# Patient Record
Sex: Female | Born: 1935 | Race: Black or African American | Hispanic: No | Marital: Married | Smoking: Former smoker
Health system: Southern US, Community
[De-identification: ages and names within clinical notes are randomized; demographics above are authoritative.]

## PROBLEM LIST (undated history)

## (undated) DIAGNOSIS — E119 Type 2 diabetes mellitus without complications: Secondary | ICD-10-CM

## (undated) DIAGNOSIS — J189 Pneumonia, unspecified organism: Secondary | ICD-10-CM

## (undated) DIAGNOSIS — I10 Essential (primary) hypertension: Secondary | ICD-10-CM

## (undated) HISTORY — PX: THYROID SURGERY: SHX805

## (undated) HISTORY — PX: OTHER SURGICAL HISTORY: SHX169

---

## 2013-09-14 DIAGNOSIS — J189 Pneumonia, unspecified organism: Secondary | ICD-10-CM

## 2013-09-14 HISTORY — DX: Pneumonia, unspecified organism: J18.9

## 2014-09-19 ENCOUNTER — Emergency Department (HOSPITAL_COMMUNITY): Payer: Medicare Other

## 2014-09-19 ENCOUNTER — Encounter (HOSPITAL_COMMUNITY): Payer: Self-pay | Admitting: Emergency Medicine

## 2014-09-19 ENCOUNTER — Emergency Department (HOSPITAL_COMMUNITY)
Admission: EM | Admit: 2014-09-19 | Discharge: 2014-09-19 | Disposition: A | Discharge status: Home / Self Care | Payer: Medicare Other | Attending: Emergency Medicine | Admitting: Emergency Medicine

## 2014-09-19 DIAGNOSIS — R0600 Dyspnea, unspecified: Secondary | ICD-10-CM

## 2014-09-19 DIAGNOSIS — E119 Type 2 diabetes mellitus without complications: Secondary | ICD-10-CM | POA: Diagnosis not present

## 2014-09-19 DIAGNOSIS — Z87891 Personal history of nicotine dependence: Secondary | ICD-10-CM | POA: Insufficient documentation

## 2014-09-19 DIAGNOSIS — J40 Bronchitis, not specified as acute or chronic: Secondary | ICD-10-CM | POA: Diagnosis not present

## 2014-09-19 DIAGNOSIS — I10 Essential (primary) hypertension: Secondary | ICD-10-CM | POA: Insufficient documentation

## 2014-09-19 DIAGNOSIS — R0602 Shortness of breath: Secondary | ICD-10-CM | POA: Diagnosis present

## 2014-09-19 HISTORY — DX: Essential (primary) hypertension: I10

## 2014-09-19 HISTORY — DX: Type 2 diabetes mellitus without complications: E11.9

## 2014-09-19 MED ORDER — ALBUTEROL SULFATE HFA 108 (90 BASE) MCG/ACT IN AERS
2.0000 | INHALATION_SPRAY | Freq: Once | RESPIRATORY_TRACT | Status: AC
  Administered 2014-09-19: 2 via RESPIRATORY_TRACT
  Filled 2014-09-19: qty 6.7

## 2014-09-19 MED ORDER — ALBUTEROL SULFATE (2.5 MG/3ML) 0.083% IN NEBU: 2.5000 mg | INHALATION_SOLUTION | Freq: Four times a day (QID) | RESPIRATORY_TRACT | Status: DC | PRN

## 2014-09-19 MED ORDER — ALBUTEROL (5 MG/ML) CONTINUOUS INHALATION SOLN
10.0000 mg/h | INHALATION_SOLUTION | RESPIRATORY_TRACT | Status: DC
  Administered 2014-09-19: 10 mg/h via RESPIRATORY_TRACT
  Filled 2014-09-19: qty 20

## 2014-09-19 MED ORDER — PREDNISONE 20 MG PO TABS
40.0000 mg | ORAL_TABLET | Freq: Once | ORAL | Status: AC
  Administered 2014-09-19: 40 mg via ORAL
  Filled 2014-09-19: qty 2

## 2014-09-19 MED ORDER — PREDNISONE 20 MG PO TABS: 40.0000 mg | ORAL_TABLET | Freq: Every day | ORAL | Status: DC

## 2014-09-19 MED ORDER — AEROCHAMBER Z-STAT PLUS/MEDIUM MISC
1.0000 | Freq: Once | Status: AC
  Administered 2014-09-19: 1

## 2014-09-19 MED ORDER — AEROCHAMBER PLUS W/MASK MISC
1.0000 | Freq: Once | Status: DC
  Filled 2014-09-19: qty 1

## 2014-09-19 MED ORDER — IPRATROPIUM-ALBUTEROL 0.5-2.5 (3) MG/3ML IN SOLN
3.0000 mL | Freq: Once | RESPIRATORY_TRACT | Status: AC
  Administered 2014-09-19: 3 mL via RESPIRATORY_TRACT
  Filled 2014-09-19: qty 3

## 2014-09-19 NOTE — Discharge Instructions (Signed)
How to Use an Inhaler Proper inhaler technique is very important. Good technique ensures that the medicine reaches the lungs. Poor technique results in depositing the medicine on the tongue and back of the throat rather than in the airways. If you do not use the inhaler with good technique, the medicine will not help you. STEPS TO FOLLOW IF USING AN INHALER WITHOUT AN EXTENSION TUBE 1. Remove the cap from the inhaler. 2. If you are using the inhaler for the first time, you will need to prime it. Shake the inhaler for 5 seconds and release four puffs into the air, away from your face. Ask your health care provider or pharmacist if you have questions about priming your inhaler. 3. Shake the inhaler for 5 seconds before each breath in (inhalation). 4. Position the inhaler so that the top of the canister faces up. 5. Put your index finger on the top of the medicine canister. Your thumb supports the bottom of the inhaler. 6. Open your mouth. 7. Either place the inhaler between your teeth and place your lips tightly around the mouthpiece, or hold the inhaler 1-2 inches away from your open mouth. If you are unsure of which technique to use, ask your health care provider. 8. Breathe out (exhale) normally and as completely as possible. 9. Press the canister down with your index finger to release the medicine. 10. At the same time as the canister is pressed, inhale deeply and slowly until your lungs are completely filled. This should take 4-6 seconds. Keep your tongue down. 11. Hold the medicine in your lungs for 5-10 seconds (10 seconds is best). This helps the medicine get into the small airways of your lungs. 12. Breathe out slowly, through pursed lips. Whistling is an example of pursed lips. 13. Wait at least 15-30 seconds between puffs. Continue with the above steps until you have taken the number of puffs your health care provider has ordered. Do not use the inhaler more than your health care provider  tells you. 14. Replace the cap on the inhaler. 15. Follow the directions from your health care provider or the inhaler insert for cleaning the inhaler. STEPS TO FOLLOW IF USING AN INHALER WITH AN EXTENSION (SPACER) 1. Remove the cap from the inhaler. 2. If you are using the inhaler for the first time, you will need to prime it. Shake the inhaler for 5 seconds and release four puffs into the air, away from your face. Ask your health care provider or pharmacist if you have questions about priming your inhaler. 3. Shake the inhaler for 5 seconds before each breath in (inhalation). 4. Place the open end of the spacer onto the mouthpiece of the inhaler. 5. Position the inhaler so that the top of the canister faces up and the spacer mouthpiece faces you. 6. Put your index finger on the top of the medicine canister. Your thumb supports the bottom of the inhaler and the spacer. 7. Breathe out (exhale) normally and as completely as possible. 8. Immediately after exhaling, place the spacer between your teeth and into your mouth. Close your lips tightly around the spacer. 9. Press the canister down with your index finger to release the medicine. 10. At the same time as the canister is pressed, inhale deeply and slowly until your lungs are completely filled. This should take 4-6 seconds. Keep your tongue down and out of the way. 11. Hold the medicine in your lungs for 5-10 seconds (10 seconds is best). This helps the   medicine get into the small airways of your lungs. Exhale. 12. Repeat inhaling deeply through the spacer mouthpiece. Again hold that breath for up to 10 seconds (10 seconds is best). Exhale slowly. If it is difficult to take this second deep breath through the spacer, breathe normally several times through the spacer. Remove the spacer from your mouth. 13. Wait at least 15-30 seconds between puffs. Continue with the above steps until you have taken the number of puffs your health care provider has  ordered. Do not use the inhaler more than your health care provider tells you. 14. Remove the spacer from the inhaler, and place the cap on the inhaler. 15. Follow the directions from your health care provider or the inhaler insert for cleaning the inhaler and spacer. If you are using different kinds of inhalers, use your quick relief medicine to open the airways 10-15 minutes before using a steroid if instructed to do so by your health care provider. If you are unsure which inhalers to use and the order of using them, ask your health care provider, nurse, or respiratory therapist. If you are using a steroid inhaler, always rinse your mouth with water after your last puff, then gargle and spit out the water. Do not swallow the water. AVOID:  Inhaling before or after starting the spray of medicine. It takes practice to coordinate your breathing with triggering the spray.  Inhaling through the nose (rather than the mouth) when triggering the spray. HOW TO DETERMINE IF YOUR INHALER IS FULL OR NEARLY EMPTY You cannot know when an inhaler is empty by shaking it. A few inhalers are now being made with dose counters. Ask your health care provider for a prescription that has a dose counter if you feel you need that extra help. If your inhaler does not have a counter, ask your health care provider to help you determine the date you need to refill your inhaler. Write the refill date on a calendar or your inhaler canister. Refill your inhaler 7-10 days before it runs out. Be sure to keep an adequate supply of medicine. This includes making sure it is not expired, and that you have a spare inhaler.  SEEK MEDICAL CARE IF:   Your symptoms are only partially relieved with your inhaler.  You are having trouble using your inhaler.  You have some increase in phlegm. SEEK IMMEDIATE MEDICAL CARE IF:   You feel little or no relief with your inhalers. You are still wheezing and are feeling shortness of breath or  tightness in your chest or both.  You have dizziness, headaches, or a fast heart rate.  You have chills, fever, or night sweats.  You have a noticeable increase in phlegm production, or there is blood in the phlegm. MAKE SURE YOU:   Understand these instructions.  Will watch your condition.  Will get help right away if you are not doing well or get worse. Document Released: 08/28/2000 Document Revised: 06/21/2013 Document Reviewed: 03/30/2013 Exit[employer] Patient Information 2015 [employer]. This information is not intended to replace advice given to you by your health care provider. Make sure you discuss any questions you have with your health care provider.  

## 2014-09-19 NOTE — ED Notes (Addendum)
Post treatment pt reports unlabored breathing. Pt is speaking in full sentences. E wheezing still present. MD notified.

## 2014-09-19 NOTE — ED Provider Notes (Signed)
CSN: [id number]     Arrival date & time 09/19/14  [id number] History   First MD Initiated Contact with Patient 09/19/14 [id number]     Chief Complaint  Patient presents with  . Shortness of Breath     (Consider location/radiation/quality/duration/timing/severity/associated sxs/prior Treatment) HPI   87yF with cough and SOB. Onset about two weeks ago. Worse in past 2-3 days. Coughing, wheezing and feels SOB. Has been using albuterol MDI with some relief but daughter says she does not always use it properly. "I get hot and cold, but I'm a diabetic and I'm always like that." Denies pain anywhere. No unusual lleg pain or swelling. Quit smoking 50 years ago.   Past Medical History  Diagnosis Date  . Diabetes mellitus without complication   . Hypertension    History reviewed. No pertinent past surgical history. No family history on file. History  Substance Use Topics  . Smoking status: Former Games developermoker  . Smokeless tobacco: Not on file  . Alcohol Use: No   OB History    No data available     Review of Systems  All systems reviewed and negative, other than as noted in HPI.   Allergies  Review of patient's allergies indicates no known allergies.  Home Medications   Prior to Admission medications   Not on File   BP 144/61 mmHg  Pulse 76  Temp(Src) 98.1 F (36.7 C) (Oral)  Resp 24  SpO2 100% Physical Exam  Constitutional: She appears well-developed and well-nourished. No distress.  Ambulated to bed w/o apparent difficulty. Speaking on complete sentences.   HENT:  Head: Normocephalic and atraumatic.  Eyes: Conjunctivae are normal. Right eye exhibits no discharge. Left eye exhibits no discharge.  Neck: Neck supple.  Cardiovascular: Normal rate, regular rhythm and normal heart sounds.  Exam reveals no gallop and no friction rub.   No murmur heard. Pulmonary/Chest: Effort normal. No respiratory distress. She has wheezes.  Abdominal: Soft. She exhibits no distension. There is no  tenderness.  Musculoskeletal: She exhibits no edema or tenderness.  Neurological: She is alert.  Skin: Skin is warm and dry.  Psychiatric: She has a normal mood and affect. Her behavior is normal. Thought content normal.  Nursing note and vitals reviewed.   ED Course  Procedures (including critical care time) Labs Review Labs Reviewed - No data to display  Imaging Review No results found.   Dg Chest 2 View  09/19/2014   CLINICAL DATA:  Shortness of breath.  Cough.  Dyspnea.  EXAM: CHEST  2 VIEW  COMPARISON:  None.  FINDINGS: Low lung volumes are noted, however both lungs are clear. No evidence of pleural effusion. Heart size is within normal limits.  IMPRESSION: Low lung volumes.  No active disease.   Electronically Signed   By: Myles RosenthalJohn  Stahl M.D.   On: 09/19/2014 08:25    EKG Interpretation None      MDM   Final diagnoses:  Dyspnea  Bronchitis    87yF with cough/wheezing. Likely bronchitis. Appears well. Some wheezing on exam but no significantly increased WOB. Afebrile. Nebs/steroids. Will check CXR. Doubt ACS. Doubt PE. Doubt dissection.   CXr w/o focal infiltrate. Wheezing on exam but able to carry on conversation and no accessory muscle usage. o2 sats normal on RA. Afebrile. Will tx for likley viral bronchitis. Daughter requesting script for nebulizer machine.     Raeford RazorStephen Ireene Ballowe, MD 09/21/14 [phone]

## 2014-09-19 NOTE — ED Notes (Addendum)
Pt c/o SOB, wheezing, and coughing x 2 weeks. Denies pain but states her chest feels tight. Pt states her legs and feet have been swelling as well.

## 2014-09-19 NOTE — ED Notes (Signed)
Pt continues to have E wheezing but unlabored and speaking in full sentences. Pt NAD. MD notified and at bedside at present time.

## 2014-10-01 ENCOUNTER — Emergency Department (HOSPITAL_COMMUNITY): Payer: Medicare Other

## 2014-10-01 ENCOUNTER — Inpatient Hospital Stay (HOSPITAL_COMMUNITY): Payer: Medicare Other

## 2014-10-01 ENCOUNTER — Inpatient Hospital Stay (HOSPITAL_COMMUNITY)
Admission: EM | Admit: 2014-10-01 | Discharge: 2014-10-04 | DRG: 202 | Disposition: A | Discharge status: Home / Self Care | Payer: Medicare Other | Attending: Internal Medicine | Admitting: Internal Medicine

## 2014-10-01 ENCOUNTER — Encounter (HOSPITAL_COMMUNITY): Payer: Self-pay | Admitting: Emergency Medicine

## 2014-10-01 DIAGNOSIS — E119 Type 2 diabetes mellitus without complications: Secondary | ICD-10-CM

## 2014-10-01 DIAGNOSIS — I10 Essential (primary) hypertension: Secondary | ICD-10-CM | POA: Diagnosis present

## 2014-10-01 DIAGNOSIS — N179 Acute kidney failure, unspecified: Secondary | ICD-10-CM | POA: Insufficient documentation

## 2014-10-01 DIAGNOSIS — Z87891 Personal history of nicotine dependence: Secondary | ICD-10-CM

## 2014-10-01 DIAGNOSIS — IMO0002 Reserved for concepts with insufficient information to code with codable children: Secondary | ICD-10-CM

## 2014-10-01 DIAGNOSIS — R229 Localized swelling, mass and lump, unspecified: Secondary | ICD-10-CM

## 2014-10-01 DIAGNOSIS — J40 Bronchitis, not specified as acute or chronic: Secondary | ICD-10-CM | POA: Insufficient documentation

## 2014-10-01 DIAGNOSIS — J209 Acute bronchitis, unspecified: Principal | ICD-10-CM | POA: Diagnosis present

## 2014-10-01 DIAGNOSIS — R918 Other nonspecific abnormal finding of lung field: Secondary | ICD-10-CM | POA: Diagnosis present

## 2014-10-01 DIAGNOSIS — R0602 Shortness of breath: Secondary | ICD-10-CM

## 2014-10-01 DIAGNOSIS — Z794 Long term (current) use of insulin: Secondary | ICD-10-CM | POA: Diagnosis not present

## 2014-10-01 DIAGNOSIS — E041 Nontoxic single thyroid nodule: Secondary | ICD-10-CM | POA: Diagnosis present

## 2014-10-01 DIAGNOSIS — R079 Chest pain, unspecified: Secondary | ICD-10-CM

## 2014-10-01 DIAGNOSIS — Z7982 Long term (current) use of aspirin: Secondary | ICD-10-CM | POA: Diagnosis not present

## 2014-10-01 DIAGNOSIS — E079 Disorder of thyroid, unspecified: Secondary | ICD-10-CM | POA: Insufficient documentation

## 2014-10-01 HISTORY — DX: Pneumonia, unspecified organism: J18.9

## 2014-10-01 LAB — EXPECTORATED SPUTUM ASSESSMENT W GRAM STAIN, RFLX TO RESP C

## 2014-10-01 LAB — EXPECTORATED SPUTUM ASSESSMENT W REFEX TO RESP CULTURE

## 2014-10-01 LAB — GLUCOSE, CAPILLARY
Glucose-Capillary: 357 mg/dL — ABNORMAL HIGH (ref 70–99)
Glucose-Capillary: 405 mg/dL — ABNORMAL HIGH (ref 70–99)

## 2014-10-01 LAB — BASIC METABOLIC PANEL
ANION GAP: 7 (ref 5–15)
BUN: 18 mg/dL (ref 6–23)
CO2: 28 mmol/L (ref 19–32)
Calcium: 8.3 mg/dL — ABNORMAL LOW (ref 8.4–10.5)
Chloride: 102 mEq/L (ref 96–112)
Creatinine, Ser: 1.21 mg/dL — ABNORMAL HIGH (ref 0.50–1.10)
GFR calc Af Amer: 46 mL/min — ABNORMAL LOW (ref >90)
GFR, EST NON AFRICAN AMERICAN: 39 mL/min — AB (ref >90)
Glucose, Bld: 207 mg/dL — ABNORMAL HIGH (ref 70–99)
POTASSIUM: 3.7 mmol/L (ref 3.5–5.1)
SODIUM: 137 mmol/L (ref 135–145)

## 2014-10-01 LAB — CBC WITH DIFFERENTIAL/PLATELET
Basophils Absolute: 0 10*3/uL (ref 0.0–0.1)
Basophils Relative: 0 % (ref 0–1)
Eosinophils Absolute: 0.7 10*3/uL (ref 0.0–0.7)
Eosinophils Relative: 7 % — ABNORMAL HIGH (ref 0–5)
HCT: 34.3 % — ABNORMAL LOW (ref 36.0–46.0)
HEMOGLOBIN: 11 g/dL — AB (ref 12.0–15.0)
Lymphocytes Relative: 22 % (ref 12–46)
Lymphs Abs: 2.2 10*3/uL (ref 0.7–4.0)
MCH: 30.1 pg (ref 26.0–34.0)
MCHC: 32.1 g/dL (ref 30.0–36.0)
MCV: 93.7 fL (ref 78.0–100.0)
MONOS PCT: 8 % (ref 3–12)
Monocytes Absolute: 0.8 10*3/uL (ref 0.1–1.0)
NEUTROS PCT: 63 % (ref 43–77)
Neutro Abs: 6.4 10*3/uL (ref 1.7–7.7)
PLATELETS: 194 10*3/uL (ref 150–400)
RBC: 3.66 MIL/uL — AB (ref 3.87–5.11)
RDW: 14.6 % (ref 11.5–15.5)
WBC: 10.1 10*3/uL (ref 4.0–10.5)

## 2014-10-01 LAB — URINALYSIS, ROUTINE W REFLEX MICROSCOPIC
Bilirubin Urine: NEGATIVE
Hgb urine dipstick: NEGATIVE
KETONES UR: NEGATIVE mg/dL
Nitrite: NEGATIVE
PH: 6 (ref 5.0–8.0)
PROTEIN: NEGATIVE mg/dL
Specific Gravity, Urine: 1.022 (ref 1.005–1.030)
Urobilinogen, UA: 1 mg/dL (ref 0.0–1.0)

## 2014-10-01 LAB — URINE MICROSCOPIC-ADD ON

## 2014-10-01 LAB — D-DIMER, QUANTITATIVE: D-Dimer, Quant: 1.58 ug/mL-FEU — ABNORMAL HIGH (ref 0.00–0.48)

## 2014-10-01 LAB — GLUCOSE, RANDOM: Glucose, Bld: 416 mg/dL — ABNORMAL HIGH (ref 70–99)

## 2014-10-01 LAB — BRAIN NATRIURETIC PEPTIDE: B NATRIURETIC PEPTIDE 5: 77.2 pg/mL (ref 0.0–100.0)

## 2014-10-01 LAB — TROPONIN I: Troponin I: <0.03 ng/mL (ref <0.031)

## 2014-10-01 MED ORDER — IPRATROPIUM BROMIDE 0.02 % IN SOLN
0.5000 mg | Freq: Four times a day (QID) | RESPIRATORY_TRACT | Status: DC
  Administered 2014-10-01: 0.5 mg via RESPIRATORY_TRACT
  Filled 2014-10-01: qty 2.5

## 2014-10-01 MED ORDER — IOHEXOL 300 MG/ML  SOLN
100.0000 mL | Freq: Once | INTRAMUSCULAR | Status: AC | PRN
  Administered 2014-10-01: 100 mL via INTRAVENOUS

## 2014-10-01 MED ORDER — IPRATROPIUM-ALBUTEROL 0.5-2.5 (3) MG/3ML IN SOLN
3.0000 mL | Freq: Four times a day (QID) | RESPIRATORY_TRACT | Status: DC
  Administered 2014-10-02 – 2014-10-04 (×11): 3 mL via RESPIRATORY_TRACT
  Filled 2014-10-01 (×11): qty 3

## 2014-10-01 MED ORDER — GERITOL COMPLETE PO TABS: 1.0000 | ORAL_TABLET | Freq: Every day | ORAL | Status: DC

## 2014-10-01 MED ORDER — LEVOFLOXACIN IN D5W 750 MG/150ML IV SOLN
750.0000 mg | INTRAVENOUS | Status: DC
  Administered 2014-10-01: 750 mg via INTRAVENOUS
  Filled 2014-10-01: qty 150

## 2014-10-01 MED ORDER — ZOLPIDEM TARTRATE 5 MG PO TABS: 5.0000 mg | ORAL_TABLET | Freq: Every evening | ORAL | Status: DC | PRN

## 2014-10-01 MED ORDER — METHYLPREDNISOLONE SODIUM SUCC 125 MG IJ SOLR
125.0000 mg | Freq: Once | INTRAMUSCULAR | Status: AC
  Administered 2014-10-01: 125 mg via INTRAVENOUS
  Filled 2014-10-01: qty 2

## 2014-10-01 MED ORDER — ACETAMINOPHEN 650 MG RE SUPP: 650.0000 mg | Freq: Four times a day (QID) | RECTAL | Status: DC | PRN

## 2014-10-01 MED ORDER — TECHNETIUM TC 99M DIETHYLENETRIAME-PENTAACETIC ACID: 37.0000 | Freq: Once | INTRAVENOUS | Status: AC | PRN

## 2014-10-01 MED ORDER — DEXAMETHASONE SODIUM PHOSPHATE 4 MG/ML IJ SOLN
4.0000 mg | Freq: Three times a day (TID) | INTRAMUSCULAR | Status: DC
  Administered 2014-10-01 – 2014-10-02 (×2): 4 mg via INTRAVENOUS
  Filled 2014-10-01 (×5): qty 1

## 2014-10-01 MED ORDER — CARBOXYMETHYLCELLUL-GLYCERIN 1-0.25 % OP SOLN: 1.0000 [drp] | Freq: Every day | OPHTHALMIC | Status: DC

## 2014-10-01 MED ORDER — ACETAMINOPHEN 325 MG PO TABS: 650.0000 mg | ORAL_TABLET | Freq: Four times a day (QID) | ORAL | Status: DC | PRN

## 2014-10-01 MED ORDER — MORPHINE SULFATE 2 MG/ML IJ SOLN: 2.0000 mg | INTRAMUSCULAR | Status: DC | PRN

## 2014-10-01 MED ORDER — ATENOLOL 50 MG PO TABS
50.0000 mg | ORAL_TABLET | Freq: Every day | ORAL | Status: DC
  Administered 2014-10-02 – 2014-10-04 (×3): 50 mg via ORAL
  Filled 2014-10-01 (×4): qty 1

## 2014-10-01 MED ORDER — INSULIN ASPART 100 UNIT/ML ~~LOC~~ SOLN
0.0000 [IU] | Freq: Three times a day (TID) | SUBCUTANEOUS | Status: DC
  Administered 2014-10-01: 15 [IU] via SUBCUTANEOUS
  Administered 2014-10-02 (×2): 8 [IU] via SUBCUTANEOUS
  Administered 2014-10-02: 5 [IU] via SUBCUTANEOUS
  Administered 2014-10-02: 8 [IU] via SUBCUTANEOUS
  Administered 2014-10-03: 11 [IU] via SUBCUTANEOUS
  Administered 2014-10-03: 8 [IU] via SUBCUTANEOUS

## 2014-10-01 MED ORDER — SODIUM CHLORIDE 0.9 % IV SOLN
INTRAVENOUS | Status: AC
  Administered 2014-10-01: 19:00:00 via INTRAVENOUS

## 2014-10-01 MED ORDER — SODIUM CHLORIDE 0.9 % IV SOLN
INTRAVENOUS | Status: DC
  Administered 2014-10-04: 20 mL/h via INTRAVENOUS

## 2014-10-01 MED ORDER — POLYVINYL ALCOHOL 1.4 % OP SOLN
1.0000 [drp] | OPHTHALMIC | Status: DC | PRN
  Filled 2014-10-01: qty 15

## 2014-10-01 MED ORDER — TECHNETIUM TO 99M ALBUMIN AGGREGATED: 5.3000 | Freq: Once | INTRAVENOUS | Status: AC | PRN

## 2014-10-01 MED ORDER — INSULIN GLARGINE 100 UNIT/ML ~~LOC~~ SOLN
18.0000 [IU] | Freq: Every day | SUBCUTANEOUS | Status: DC
  Administered 2014-10-01 – 2014-10-02 (×2): 18 [IU] via SUBCUTANEOUS
  Filled 2014-10-01 (×3): qty 0.18

## 2014-10-01 MED ORDER — ADULT MULTIVITAMIN W/MINERALS CH
1.0000 | ORAL_TABLET | Freq: Every day | ORAL | Status: DC
  Administered 2014-10-02 – 2014-10-04 (×3): 1 via ORAL
  Filled 2014-10-01 (×3): qty 1

## 2014-10-01 MED ORDER — ASPIRIN EC 81 MG PO TBEC
81.0000 mg | DELAYED_RELEASE_TABLET | Freq: Every day | ORAL | Status: DC
  Administered 2014-10-02 – 2014-10-04 (×3): 81 mg via ORAL
  Filled 2014-10-01 (×4): qty 1

## 2014-10-01 MED ORDER — GUAIFENESIN ER 600 MG PO TB12
1200.0000 mg | ORAL_TABLET | Freq: Two times a day (BID) | ORAL | Status: DC
  Administered 2014-10-01 – 2014-10-04 (×6): 1200 mg via ORAL
  Filled 2014-10-01 (×7): qty 2

## 2014-10-01 MED ORDER — ALBUTEROL SULFATE (2.5 MG/3ML) 0.083% IN NEBU: 2.5000 mg | INHALATION_SOLUTION | RESPIRATORY_TRACT | Status: DC | PRN

## 2014-10-01 MED ORDER — SENNOSIDES-DOCUSATE SODIUM 8.6-50 MG PO TABS: 1.0000 | ORAL_TABLET | Freq: Every evening | ORAL | Status: DC | PRN

## 2014-10-01 MED ORDER — INSULIN ASPART 100 UNIT/ML ~~LOC~~ SOLN: 0.0000 [IU] | Freq: Three times a day (TID) | SUBCUTANEOUS | Status: DC

## 2014-10-01 MED ORDER — INSULIN ASPART 100 UNIT/ML ~~LOC~~ SOLN
0.0000 [IU] | Freq: Three times a day (TID) | SUBCUTANEOUS | Status: DC
  Administered 2014-10-01: 15 [IU] via SUBCUTANEOUS

## 2014-10-01 MED ORDER — HYDROCODONE-ACETAMINOPHEN 5-325 MG PO TABS: 1.0000 | ORAL_TABLET | ORAL | Status: DC | PRN

## 2014-10-01 MED ORDER — FUROSEMIDE 40 MG PO TABS
40.0000 mg | ORAL_TABLET | Freq: Every day | ORAL | Status: DC
  Administered 2014-10-01 – 2014-10-02 (×2): 40 mg via ORAL
  Filled 2014-10-01 (×2): qty 1

## 2014-10-01 MED ORDER — IPRATROPIUM-ALBUTEROL 0.5-2.5 (3) MG/3ML IN SOLN
3.0000 mL | Freq: Once | RESPIRATORY_TRACT | Status: AC
  Administered 2014-10-01: 3 mL via RESPIRATORY_TRACT
  Filled 2014-10-01: qty 3

## 2014-10-01 MED ORDER — ENOXAPARIN SODIUM 40 MG/0.4ML ~~LOC~~ SOLN
40.0000 mg | SUBCUTANEOUS | Status: DC
  Administered 2014-10-01: 40 mg via SUBCUTANEOUS
  Filled 2014-10-01 (×2): qty 0.4

## 2014-10-01 MED ORDER — ALBUTEROL (5 MG/ML) CONTINUOUS INHALATION SOLN
10.0000 mg/h | INHALATION_SOLUTION | Freq: Once | RESPIRATORY_TRACT | Status: AC
  Administered 2014-10-01: 10 mg/h via RESPIRATORY_TRACT
  Filled 2014-10-01: qty 20

## 2014-10-01 NOTE — H&P (Signed)
Patient Demographics  Diana Clayton, is a 81 y.o. female  MRN: [id number]   DOB - 11-18-1926  Admit Date - 10/01/2014  Outpatient Primary MD for the patient is Beverley Fiedler, MD     Assessment Diana Clayton is a pleasant 81 year old female with diabetes mellitus type 2/central hypertension who comes in with increasing shortness of breath associated with cough productive of yellow colored sputum ongoing for the last 2 weeks and she is found to have acute bronchitis with chest x-ray suggesting no edema or consolidation. White count is normal . However, patient was seen on 09/19/2014 with similar complaints and her daughter mentions that she had similar symptoms within this past year and she is worried something is going on in the lungs. Patient was given bronchodilators and systemic steroids in the ED with some relief of her symptoms and she is being referred for further management. She denies sick contacts. Given recurrence of symptoms, would want to make sure there is no postobstructive process. Will therefore obtain CT chest with contrast. Patient already had VQ scan which showed low probability for PE. She also has left leg edema, therefore will obtain venous Doppler. Meanwhile, will obtain sputum culture, give bronchodilators, systemic steroids, oxygen supplementation as needed and empiric antibiotics. Will also obtain 2-D echocardiogram. In terms of her diabetes, will hold 70/30 now and give Lantus/SSI. Will also check hemoglobin A1c/TSH. Plan  Acute bronchitis  Admit MedSurg  Sputum culture  2-D echocardiogram/CT chest/venous Doppler left leg  Albuterol/Atrovent/dexamethasone/Levaquin/oxygen supplementation as needed DM2 (diabetes mellitus, type 2)  Hemoglobin A1c  Lantus/SSI Essential hypertension  Resume home medications. DVT/GI Prophylaxis   Lovenox   PPI  AM Labs Ordered, also please review Full Orders  Family Communication: Admission, patients condition and  plan of care including tests being ordered have been discussed with the patient and daughter who indicate understanding and agree with the plan and Code Status.  Code Status full code  Likely DC to  home  Condition GUARDED    Time spent in minutes : 55 minutes   With History of -  Past Medical History  Diagnosis Date  . Diabetes mellitus without complication   . Hypertension   . Pneumonia 2015      Past Surgical History  Procedure Laterality Date  . Left hand surgery    . Thyroid surgery      in for   Chief Complaint  Patient presents with  . Cough  . Shortness of Breath     HPI  Diana Clayton  is a 81 y.o. female, who presents with worsening shortness of breath associated with cough productive of greenish yellow colored sputum for the past 2 weeks. She was seen in the ED on 09/19/2014 at which point she was given tapering dose of steroids and bronchodilators. She reports that she felt a little better but the symptoms recurred prompting ED visit today. She denies any fever, nausea, vomiting or diarrhea. No dysuria. She has had some shortness of breath but not chest pain. Denies sick contacts. Denies weight loss.    Review of Systems    In addition to the HPI above,  No Fever-chills, No Headache, No changes with Vision or hearing, No problems swallowing food or Liquids, No Chest pain, but Cough and Shortness of Breath, No Abdominal pain, No Nausea or Vommitting, Bowel movements are regular, No Blood in stool or Urine, No dysuria, No new skin rashes or bruises, No new joints pains-aches,  No new weakness, tingling, numbness  in any extremity, No recent weight gain or loss, No polyuria, polydypsia or polyphagia, No significant Mental Stressors.  A full 10 point Review of Systems was done, except as stated above, all other Review of Systems were negative.   Social History History  Substance Use Topics  . Smoking status: Former Smoker    Quit date: 10/01/1964    . Smokeless tobacco: Never Used  . Alcohol Use: No    Family History Family History  Problem Relation Age of Onset  . Diabetes Mother   . Diabetes Brother   . Diabetes Brother     Prior to Admission medications   Medication Sig Start Date End Date Taking? Authorizing Provider  albuterol (PROVENTIL HFA;VENTOLIN HFA) 108 (90 BASE) MCG/ACT inhaler Inhale 2 puffs into the lungs every 6 (six) hours as needed for wheezing or shortness of breath.   Yes Historical Provider, MD  albuterol (PROVENTIL) (2.5 MG/3ML) 0.083% nebulizer solution Take 3 mLs (2.5 mg total) by nebulization every 6 (six) hours as needed for wheezing or shortness of breath. 09/19/14  Yes Raeford RazorStephen Kohut, MD  aspirin EC 81 MG tablet Take 81 mg by mouth daily with breakfast.   Yes Historical Provider, MD  atenolol (TENORMIN) 50 MG tablet Take 50 mg by mouth daily with breakfast.   Yes Historical Provider, MD  Carboxymethylcellul-Glycerin (CLEAR EYES FOR DRY EYES) 1-0.25 % SOLN Place 1 drop into both eyes daily. For allergies and dry eyes   Yes Historical Provider, MD  furosemide (LASIX) 40 MG tablet Take 40 mg by mouth daily.   Yes Historical Provider, MD  insulin NPH-regular Human (NOVOLIN 70/30) (70-30) 100 UNIT/ML injection Inject 20-34 Units into the skin 2 (two) times daily with a meal. Takes 34 units in the morning and 20 units at night   Yes Historical Provider, MD  Iron-Vitamins (GERITOL COMPLETE) TABS Take 1 tablet by mouth daily.   Yes Historical Provider, MD  Omega-3 Fatty Acids (FISH OIL) 600 MG CAPS Take 600 mg by mouth daily with breakfast.   Yes Historical Provider, MD  predniSONE (DELTASONE) 20 MG tablet Take 2 tablets (40 mg total) by mouth daily. Patient not taking: Reported on 10/01/2014 09/19/14   Raeford RazorStephen Kohut, MD    No Known Allergies  Physical Exam  Vitals  Blood pressure 124/47, pulse 96, temperature 98.8 F (37.1 C), temperature source Oral, resp. rate 18, height 5' 9.5" (1.765 m), weight 108.863 kg  (240 lb), SpO2 95 %.   1. General  lying in bed in NAD,    2. Normal affect and insight, Not Suicidal or Homicidal, Awake Alert, Oriented X 3.  3. No F.N deficits, ALL C.Nerves Intact, Strength 5/5 all 4 extremities, Sensation intact all 4 extremities, Plantars down going.  4. Ears and Eyes appear Normal, Conjunctivae clear, PERRLA. Moist Oral Mucosa.  5. Supple Neck, No JVD, No cervical lymphadenopathy appriciated, No Carotid Bruits.  6. Symmetrical Chest wall movement, Good air movement bilaterally, bilateral wheezing. No rhonchi or rails.  7. RRR, No Gallops, Rubs or Murmurs, No Parasternal Heave.  8. Positive Bowel Sounds, Abdomen Soft, Non tender, No organomegaly appriciated,No rebound -guarding or rigidity.  9.  No Cyanosis, Normal Skin Turgor, No Skin Rash or Bruise.  10. Good muscle tone,  joints appear normal , no effusions, Normal ROM.  11. No Palpable Lymph Nodes in Neck or Axillae. Edema left leg.   Data Review  CBC  Recent Labs Lab 10/01/14 1213  WBC 10.1  HGB 11.0*  HCT 34.3*  PLT 194  MCV 93.7  MCH 30.1  MCHC 32.1  RDW 14.6  LYMPHSABS 2.2  MONOABS 0.8  EOSABS 0.7  BASOSABS 0.0   ------------------------------------------------------------------------------------------------------------------  Chemistries   Recent Labs Lab 10/01/14 1213  NA 137  K 3.7  CL 102  CO2 28  GLUCOSE 207*  BUN 18  CREATININE 1.21*  CALCIUM 8.3*   ------------------------------------------------------------------------------------------------------------------ estimated creatinine clearance is 44.3 mL/min (by C-G formula based on Cr of 1.21). ------------------------------------------------------------------------------------------------------------------ No results for input(s): TSH, T4TOTAL, T3FREE, THYROIDAB in the last 72 hours.  Invalid input(s): FREET3   Coagulation profile No results for input(s): INR, PROTIME in the last 168  hours. -------------------------------------------------------------------------------------------------------------------  Recent Labs  10/01/14 1251  DDIMER 1.58*   -------------------------------------------------------------------------------------------------------------------  Cardiac Enzymes  Recent Labs Lab 10/01/14 1213  TROPONINI <0.03   ------------------------------------------------------------------------------------------------------------------ Invalid input(s): POCBNP   ---------------------------------------------------------------------------------------------------------------  Urinalysis No results found for: COLORURINE, APPEARANCEUR, LABSPEC, PHURINE, GLUCOSEU, HGBUR, BILIRUBINUR, KETONESUR, PROTEINUR, UROBILINOGEN, NITRITE, LEUKOCYTESUR  ----------------------------------------------------------------------------------------------------------------  Imaging results:   Dg Chest 2 View  10/01/2014   CLINICAL DATA:  Cough and difficulty breathing  EXAM: CHEST  2 VIEW  COMPARISON:  September 19, 2014  FINDINGS: There is no edema or consolidation. Heart is upper normal in size with pulmonary vascularity within normal limits. No adenopathy. No bone lesions.  IMPRESSION: No edema or consolidation.   Electronically Signed   By: Bretta Bang M.D.   On: 10/01/2014 12:24   Dg Chest 2 View  09/19/2014   CLINICAL DATA:  Shortness of breath.  Cough.  Dyspnea.  EXAM: CHEST  2 VIEW  COMPARISON:  None.  FINDINGS: Low lung volumes are noted, however both lungs are clear. No evidence of pleural effusion. Heart size is within normal limits.  IMPRESSION: Low lung volumes.  No active disease.   Electronically Signed   By: Myles Rosenthal M.D.   On: 09/19/2014 08:25   Nm Pulmonary Perf And Vent  10/01/2014   CLINICAL DATA:  Shortness of breath and chest pressure  EXAM: NUCLEAR MEDICINE VENTILATION - PERFUSION LUNG SCAN  Views: Anterior, posterior, left lateral, right lateral, RPO,  LPO, RAO, LAO -ventilation and perfusion  Radionuclide: Technetium 60m DTPA -ventilation; Technetium 71m macroaggregated albumin -perfusion  Dose:  37.0 mCi-ventilation; 5.3 mCi-perfusion  Route of administration: Inhalation-ventilation; intravenous -perfusion  COMPARISON:  Chest radiograph October 01, 2014  FINDINGS: Ventilation: Radiotracer uptake is homogeneous and symmetric bilaterally. There are no appreciable ventilation defects.  Perfusion: Radiotracer uptake is homogeneous and symmetric bilaterally. There are no appreciable perfusion defects.  IMPRESSION: No appreciable ventilation or perfusion defects. Very low probability of pulmonary embolus.   Electronically Signed   By: Bretta Bang M.D.   On: 10/01/2014 14:57        Diana Clayton M.D on 10/01/2014 at 5:48 PM  Between 7am to 7pm - Pager - [phone]  After 7pm go to [email or web address] - password [email or web address]  And look for the night coverage person covering me after hours  [facility] Office  [phone]

## 2014-10-01 NOTE — ED Provider Notes (Signed)
CSN: [id number]     Arrival date & time 10/01/14  1141 History   First MD Initiated Contact with Patient 10/01/14 1152     Chief Complaint  Patient presents with  . Cough  . Shortness of Breath     (Consider location/radiation/quality/duration/timing/severity/associated sxs/prior Treatment) HPI Comments: Patient complains of increased dyspnea with wheezing and coughing. Seen here recently for similar symptoms diagnosed with bronchitis. Has been using her albuterol inhaler without relief. Denies any chest pressure chest pain. No cough. No vomiting or diarrhea. Denies any orthopnea or dyspnea on exertion. Symptoms are worsened when she laughs. They are made somewhat better with rest. Has not seen her doctor in the last 24 hours.  Patient is a 81 y.o. female presenting with cough and shortness of breath. The history is provided by the patient.  Cough Associated symptoms: shortness of breath   Shortness of Breath Associated symptoms: cough     Past Medical History  Diagnosis Date  . Diabetes mellitus without complication   . Hypertension   . Pneumonia 2015   History reviewed. No pertinent past surgical history. History reviewed. No pertinent family history. History  Substance Use Topics  . Smoking status: Former Games developer  . Smokeless tobacco: Not on file  . Alcohol Use: No   OB History    No data available     Review of Systems  Respiratory: Positive for cough and shortness of breath.   All other systems reviewed and are negative.     Allergies  Review of patient's allergies indicates no known allergies.  Home Medications   Prior to Admission medications   Medication Sig Start Date End Date Taking? Authorizing Provider  albuterol (PROVENTIL HFA;VENTOLIN HFA) 108 (90 BASE) MCG/ACT inhaler Inhale 2 puffs into the lungs every 6 (six) hours as needed for wheezing or shortness of breath.    Historical Provider, MD  albuterol (PROVENTIL) (2.5 MG/3ML) 0.083% nebulizer solution  Take 3 mLs (2.5 mg total) by nebulization every 6 (six) hours as needed for wheezing or shortness of breath. 09/19/14   Raeford Razor, MD  aspirin EC 81 MG tablet Take 81 mg by mouth daily with breakfast.    Historical Provider, MD  atenolol (TENORMIN) 50 MG tablet Take 50 mg by mouth daily with breakfast.    Historical Provider, MD  Carboxymethylcellul-Glycerin (CLEAR EYES FOR DRY EYES) 1-0.25 % SOLN Place 1 drop into both eyes daily. For allergies and dry eyes    Historical Provider, MD  furosemide (LASIX) 40 MG tablet Take 40 mg by mouth daily.    Historical Provider, MD  insulin NPH-regular Human (NOVOLIN 70/30) (70-30) 100 UNIT/ML injection Inject 20-34 Units into the skin 2 (two) times daily with a meal. Takes 34 units in the morning and 20 units at night    Historical Provider, MD  Iron-Vitamins (GERITOL COMPLETE) TABS Take 1 tablet by mouth daily.    Historical Provider, MD  Omega-3 Fatty Acids (FISH OIL) 600 MG CAPS Take 600 mg by mouth daily with breakfast.    Historical Provider, MD  predniSONE (DELTASONE) 20 MG tablet Take 2 tablets (40 mg total) by mouth daily. 09/19/14   Raeford Razor, MD   BP 161/77 mmHg  Pulse 88  Temp(Src) 98.6 F (37 C) (Oral)  Resp 22  Ht 5' 9.5" (1.765 m)  Wt 240 lb (108.863 kg)  BMI 34.95 kg/m2  SpO2 99% Physical Exam  Constitutional: She is oriented to person, place, and time. She appears well-developed and well-nourished.  Non-toxic  appearance. No distress.  HENT:  Head: Normocephalic and atraumatic.  Eyes: Conjunctivae, EOM and lids are normal. Pupils are equal, round, and reactive to light.  Neck: Normal range of motion. Neck supple. No tracheal deviation present. No thyroid mass present.  Cardiovascular: Normal rate, regular rhythm and normal heart sounds.  Exam reveals no gallop.   No murmur heard. Pulmonary/Chest: Effort normal. No stridor. No respiratory distress. She has decreased breath sounds. She has wheezes. She has no rhonchi. She has no  rales.  Abdominal: Soft. Normal appearance and bowel sounds are normal. She exhibits no distension. There is no tenderness. There is no rebound and no CVA tenderness.  Musculoskeletal: Normal range of motion. She exhibits no edema or tenderness.  Neurological: She is alert and oriented to person, place, and time. She has normal strength. No cranial nerve deficit or sensory deficit. GCS eye subscore is 4. GCS verbal subscore is 5. GCS motor subscore is 6.  Skin: Skin is warm and dry. No abrasion and no rash noted.  Psychiatric: She has a normal mood and affect. Her speech is normal and behavior is normal.  Nursing note and vitals reviewed.   ED Course  Procedures (including critical care time) Labs Review Labs Reviewed  CBC WITH DIFFERENTIAL  BASIC METABOLIC PANEL  BRAIN NATRIURETIC PEPTIDE  TROPONIN I    Imaging Review No results found.   EKG Interpretation   Date/Time:  Monday October 01 2014 12:10:04 EST Ventricular Rate:  83 PR Interval:    QRS Duration: 98 QT Interval:  391 QTC Calculation: 459 R Axis:   -19 Text Interpretation:  Atrial flutter with varied AV block, Borderline left  axis deviation Low voltage, precordial leads Abnormal R-wave progression,  early transition Borderline T abnormalities, inferior leads No significant  change since last tracing Confirmed by Cianni Manny  MD, Javious Hallisey ([id number]) on  10/01/2014 3:37:31 PM      MDM   Final diagnoses:  SOB (shortness of breath)    Patient given Solu-Medrol and albuterol here and remains short of breath. D-dimer elevated and VQ scan was negative. Will be admitted for bronchitis    Toy BakerAnthony T Josephine Wooldridge, MD 10/01/14 1537

## 2014-10-01 NOTE — ED Notes (Signed)
Pt transported to DG at present time will start interventions with return.

## 2014-10-01 NOTE — Progress Notes (Signed)
  CARE MANAGEMENT ED NOTE 10/01/2014  Patient:  Diana Clayton,Diana Clayton   Account Number:  [id number]  Date Initiated:  10/01/2014  Documentation initiated by:  Edd ArbourGIBBS,Anil Havard  Subjective/Objective Assessment:   81 yr old medicare pt recently moved to [place] per Dtr Pam     Subjective/Objective Assessment Detail:   productive (green, yellow sputum) cough for 2 weeks. Was seen here two weeks ago-given Prednisone and an Albuterol inhaler with spacer (daughter says she's not able to get full effect of inhaler). Had CXR done and daughter says there was no PNA at that time. Has had PNA and bronchitis in the past year without full recovery. Daughter says she "had been doing fine but then the cough started again." Patient is audibly wheezing intermittently. Denies fevers but says "I have been cold more than usual." Does not wear oxygen at home. Denies Hx COPD (smoked > 50 years ago). No other questions/concerns.       Pam wants to get pt in to see her MD TurkeyVictoria Rankins     Action/Plan:   updated epic for victoria rankins and provided dtr pam with a list of providers for zip [phone] to include dr rankins assisted with giving pt a diet order per Dr Freida BusmanAllen carb modified   Action/Plan Detail:   Anticipated DC Date:       Status Recommendation to Physician:   Result of Recommendation:    Other ED Services  Consult Working Plan    DC Planning Services  Other  Outpatient Services - Pt will follow up  PCP issues    Choice offered to / List presented to:            Status of service:  Completed, signed off  ED Comments:   ED Comments Detail:

## 2014-10-01 NOTE — ED Notes (Addendum)
Pt has had productive (green, yellow sputum) cough for 2 weeks. Was seen here two weeks ago-given Prednisone and an Albuterol inhaler with spacer (daughter says she's not able to get full effect of inhaler). Had CXR done and daughter says there was no PNA at that time. Has had PNA and bronchitis in the past year without full recovery. Daughter says she "had been doing fine but then the cough started again." Patient is audibly wheezing intermittently. Denies fevers but says "I have been cold more than usual." Does not wear oxygen at home. Denies Hx COPD (smoked > 50 years ago). No other questions/concerns.

## 2014-10-01 NOTE — ED Notes (Signed)
Pt transferred to NM pulmonary perfusion.

## 2014-10-02 DIAGNOSIS — I10 Essential (primary) hypertension: Secondary | ICD-10-CM

## 2014-10-02 DIAGNOSIS — N179 Acute kidney failure, unspecified: Secondary | ICD-10-CM | POA: Insufficient documentation

## 2014-10-02 DIAGNOSIS — R609 Edema, unspecified: Secondary | ICD-10-CM

## 2014-10-02 DIAGNOSIS — I359 Nonrheumatic aortic valve disorder, unspecified: Secondary | ICD-10-CM

## 2014-10-02 DIAGNOSIS — E079 Disorder of thyroid, unspecified: Secondary | ICD-10-CM

## 2014-10-02 LAB — GLUCOSE, CAPILLARY
GLUCOSE-CAPILLARY: 280 mg/dL — AB (ref 70–99)
Glucose-Capillary: 222 mg/dL — ABNORMAL HIGH (ref 70–99)
Glucose-Capillary: 316 mg/dL — ABNORMAL HIGH (ref 70–99)
Glucose-Capillary: 268 mg/dL — ABNORMAL HIGH (ref 70–99)
Glucose-Capillary: 253 mg/dL — ABNORMAL HIGH (ref 70–99)

## 2014-10-02 LAB — CBC WITH DIFFERENTIAL/PLATELET
Basophils Absolute: 0 10*3/uL (ref 0.0–0.1)
Basophils Relative: 0 % (ref 0–1)
Eosinophils Absolute: 0 10*3/uL (ref 0.0–0.7)
Eosinophils Relative: 0 % (ref 0–5)
HEMATOCRIT: 34.6 % — AB (ref 36.0–46.0)
HEMOGLOBIN: 11.1 g/dL — AB (ref 12.0–15.0)
LYMPHS PCT: 9 % — AB (ref 12–46)
Lymphs Abs: 1 10*3/uL (ref 0.7–4.0)
MCH: 29.8 pg (ref 26.0–34.0)
MCHC: 32.1 g/dL (ref 30.0–36.0)
MCV: 93 fL (ref 78.0–100.0)
MONO ABS: 0.2 10*3/uL (ref 0.1–1.0)
MONOS PCT: 1 % — AB (ref 3–12)
NEUTROS ABS: 10.3 10*3/uL — AB (ref 1.7–7.7)
Neutrophils Relative %: 90 % — ABNORMAL HIGH (ref 43–77)
Platelets: 195 10*3/uL (ref 150–400)
RBC: 3.72 MIL/uL — AB (ref 3.87–5.11)
RDW: 14.7 % (ref 11.5–15.5)
WBC: 11.5 10*3/uL — ABNORMAL HIGH (ref 4.0–10.5)

## 2014-10-02 LAB — APTT: aPTT: 33 seconds (ref 24–37)

## 2014-10-02 LAB — COMPREHENSIVE METABOLIC PANEL
ALBUMIN: 3.2 g/dL — AB (ref 3.5–5.2)
ALT: 17 U/L (ref 0–35)
AST: 31 U/L (ref 0–37)
Alkaline Phosphatase: 57 U/L (ref 39–117)
Anion gap: 11 (ref 5–15)
BUN: 25 mg/dL — ABNORMAL HIGH (ref 6–23)
CALCIUM: 8.9 mg/dL (ref 8.4–10.5)
CHLORIDE: 102 meq/L (ref 96–112)
CO2: 25 mmol/L (ref 19–32)
CREATININE: 1.51 mg/dL — AB (ref 0.50–1.10)
GFR calc Af Amer: 35 mL/min — ABNORMAL LOW (ref >90)
GFR, EST NON AFRICAN AMERICAN: 30 mL/min — AB (ref >90)
GLUCOSE: 217 mg/dL — AB (ref 70–99)
Potassium: 4.1 mmol/L (ref 3.5–5.1)
SODIUM: 138 mmol/L (ref 135–145)
TOTAL PROTEIN: 7.2 g/dL (ref 6.0–8.3)
Total Bilirubin: 0.8 mg/dL (ref 0.3–1.2)

## 2014-10-02 LAB — TSH: TSH: 0.091 u[IU]/mL — ABNORMAL LOW (ref 0.350–4.500)

## 2014-10-02 LAB — HEMOGLOBIN A1C
HEMOGLOBIN A1C: 7.1 % — AB (ref <5.7)
Mean Plasma Glucose: 157 mg/dL — ABNORMAL HIGH (ref <117)

## 2014-10-02 LAB — MAGNESIUM: Magnesium: 1.7 mg/dL (ref 1.5–2.5)

## 2014-10-02 LAB — PROTIME-INR
INR: 1.13 (ref 0.00–1.49)
Prothrombin Time: 14.6 seconds (ref 11.6–15.2)

## 2014-10-02 LAB — PHOSPHORUS: Phosphorus: 1.8 mg/dL — ABNORMAL LOW (ref 2.3–4.6)

## 2014-10-02 MED ORDER — METHYLPREDNISOLONE SODIUM SUCC 125 MG IJ SOLR
60.0000 mg | Freq: Two times a day (BID) | INTRAMUSCULAR | Status: DC
  Administered 2014-10-02 – 2014-10-03 (×3): 60 mg via INTRAVENOUS
  Filled 2014-10-02 (×4): qty 0.96

## 2014-10-02 MED ORDER — LEVOFLOXACIN IN D5W 750 MG/150ML IV SOLN
750.0000 mg | INTRAVENOUS | Status: DC
  Administered 2014-10-03: 750 mg via INTRAVENOUS
  Filled 2014-10-02 (×2): qty 150

## 2014-10-02 MED ORDER — ENOXAPARIN SODIUM 40 MG/0.4ML ~~LOC~~ SOLN
40.0000 mg | SUBCUTANEOUS | Status: DC
Start: 2014-10-04 — End: 2014-10-04
  Administered 2014-10-04: 40 mg via SUBCUTANEOUS
  Filled 2014-10-02 (×2): qty 0.4

## 2014-10-02 MED ORDER — PANTOPRAZOLE SODIUM 20 MG PO TBEC
20.0000 mg | DELAYED_RELEASE_TABLET | Freq: Every day | ORAL | Status: DC
  Administered 2014-10-02 – 2014-10-04 (×3): 20 mg via ORAL
  Filled 2014-10-02 (×3): qty 1

## 2014-10-02 MED ORDER — IPRATROPIUM-ALBUTEROL 0.5-2.5 (3) MG/3ML IN SOLN
3.0000 mL | RESPIRATORY_TRACT | Status: DC | PRN
  Administered 2014-10-03 – 2014-10-04 (×2): 3 mL via RESPIRATORY_TRACT
  Filled 2014-10-02 (×3): qty 3

## 2014-10-02 NOTE — Progress Notes (Signed)
  Echocardiogram 2D Echocardiogram has been performed.  Janalyn HarderWest, Lauralee Waters R 10/02/2014, 4:16 PM

## 2014-10-02 NOTE — Progress Notes (Signed)
*  PRELIMINARY RESULTS* Vascular Ultrasound Left lower extremity venous duplex has been completed.  Preliminary findings: no evidence of DVT.  Farrel DemarkJill Eunice, RDMS, RVT  10/02/2014, 9:08 AM

## 2014-10-02 NOTE — Progress Notes (Signed)
Inpatient Diabetes Program Recommendations  AACE/ADA: New Consensus Statement on Inpatient Glycemic Control (2013)  Target Ranges:  Prepandial:   less than 140 mg/dL      Peak postprandial:   less than 180 mg/dL (1-2 hours)      Critically ill patients:  140 - 180 mg/dL    Results for Diana Clayton, Diana Clayton (MRN [id number]) as of 10/02/2014 07:52  Ref. Range 10/01/2014 18:43 10/01/2014 21:34  Glucose-Capillary Latest Range: 70-99 mg/dL 440357 (H) 347405 (H)    Results for Diana Clayton, Diana Clayton (MRN [id number]) as of 10/02/2014 07:52  Ref. Range 10/02/2014 07:40  Glucose-Capillary Latest Range: 70-99 mg/dL 564222 (H)     Admitted with Acute Bronchitis.  History of DM, HTN.   Home DM Meds: 70/30 insulin- 34 units AM/ 20 units PM   Current Orders: Lantus 18 units QHS (started last PM)      Novolog Moderate SSI   **Note patient received 125 mg IV Solumedrol yesterday at 12pm X one dose.  **Decadron 4 mg Q8 hours started yesterday at 10pm.  **Note A1c pending.    MD- Please consider increasing patient's Lantus dose to 30 units QHS  (Patient receives approximately 38 units long-acting insulin in her doses of 70/30 insulin at home- Lantus 30 units QHS would be about 80% of the long-acting insulin dose she gets at home)     Will follow Ambrose FinlandJeannine Johnston Deonta Bomberger RN, MSN, CDE Diabetes Coordinator Inpatient Diabetes Program Team Pager: [phone] (8a-10p)

## 2014-10-02 NOTE — Progress Notes (Signed)
IR PA aware of request for left thyroid mass biopsy, will wait for US to be done and review at that time.  Pattricia BossKoreen Brittin Janik PA-C Interventional Radiology  10/02/14  12:21 PM

## 2014-10-02 NOTE — Progress Notes (Signed)
Patient ID: Diana Clayton, female   DOB: [birth year], 81 y.o.   MRN: [id number] [facility] PROGRESS NOTE  Diana Clayton WUJ:[id number] DOB: [birth year] DOA: 10/01/2014 PCP: Beverley Fiedler, MD  Brief narrative:    81 year old female with diabetes mellitus type 2, hypertension who presented to [facility] ED with worsening shortness of breath and cough productive of yellow sputum for past 2 weeks prior to this admission. Chest x-ray on this admission showed no edema or consultation. CT chest showed enlarged left thyroid lobe worrisome for large left thyroid mass. Also seen were 2 left pulmonary nodules, larger is 12 mm in the left upper lobe. Spoke with oncology on call who recommended outpatient follow-up with PET scan. For now will obtain ultrasound of the thyroid for further evaluation.   Assessment/Plan:    Active Problems: Acute bronchitis / shortness of breath / leukocytosis - VQ scan showed very low probability for pulmonary embolism. Chest x-ray did not show edema or consolidation. CT scan significant for thyroid lobe mass as well as 2 pulmonary nodules. - Patient started empirically on Levaquin. - I spoke with oncology who recommended outpatient PET scan. Recommendation was for ultrasound of the thyroid gland as well as biopsy for further evaluation. - Respiratory status stable - Will change Decadron to Solu-Medrol. Continue duo neb nebulizer every 6 hours scheduled.  Left thyroid mass - Order placed for ultrasound and biopsy.  DM2 (diabetes mellitus, type 2) - Follow-up A1c. - Continue Lantus 18 units at bedtime along with sliding scale insulin  Essential hypertension - Continue atenolol 50 mg daily. Hold Lasix because of acute renal failure.  Acute renal failure - Perhaps because of Lasix. Lasix on hold. - Follow-up BMP in the morning.   DVT Prophylaxis   Lovenox subcutaneous ordered  Code Status: Full.  Family Communication:  plan of care discussed with the  patient Disposition Plan: Home when stable.   IV access:  Peripheral IV  Procedures and diagnostic studies:    Dg Chest 2 View 10/01/2014  No edema or consolidation.   Electronically Signed   By: Bretta Bang M.D.   On: 10/01/2014 12:24   Ct Chest W Contrast 10/01/2014   Enlarged left thyroid lobe worrisome for a large left thyroid mass. Thyroid ultrasound is recommended. This could potentially contribute to breathing difficulties.  There are 2 left pulmonary nodules. The larger is 12 mm in the left upper lobe. Malignancy is not excluded. PET-CT is recommended.     Nm Pulmonary Perf And Vent 10/01/2014  No appreciable ventilation or perfusion defects. Very low probability of pulmonary embolus.   Electronically Signed   By: Bretta Bang M.D.   On: 10/01/2014 14:57    Medical Consultants:  Oncology - phone call only   Other Consultants:  Physical therapy  IAnti-Infectives:   Levaquin 10/01/2014 -->   Manson Passey, MD  [facility] Pager [phone]  If 7PM-7AM, please contact night-coverage [email or web address] Password [facility] 10/02/2014, 11:38 AM   LOS: 1 day    HPI/Subjective: No acute overnight events.  Objective: Filed Vitals:   10/01/14 1911 10/01/14 2144 10/02/14 0541 10/02/14 0947  BP:  143/63 143/60   Pulse:  95 82   Temp:  97.6 F (36.4 C) 98.1 F (36.7 C)   TempSrc:  Oral Oral   Resp:  18 18   Height:      Weight:      SpO2: 97% 98% 99% 100%    Intake/Output Summary (Last 24 hours) at 10/02/14 1138 Last data filed at  10/02/14 1124  Gross per 24 hour  Intake 1158.6 ml  Output    350 ml  Net  808.6 ml    Exam:   General:  Pt is alert, follows commands appropriately, not in acute distress  Cardiovascular: Regular rate and rhythm, S1/S2 (+)  Respiratory: wheezing in upper lobes. No rhonchi   Abdomen: Soft, non tender, non distended, bowel sounds present  Extremities: pulses DP and PT palpable bilaterally  Neuro: Grossly nonfocal  Data  Reviewed: Basic Metabolic Panel:  Recent Labs Lab 10/01/14 1213 10/01/14 2230 10/02/14 0510  NA 137  --  138  K 3.7  --  4.1  CL 102  --  102  CO2 28  --  25  GLUCOSE 207* 416* 217*  BUN 18  --  25*  CREATININE 1.21*  --  1.51*  CALCIUM 8.3*  --  8.9  MG  --   --  1.7  PHOS  --   --  1.8*   Liver Function Tests:  Recent Labs Lab 10/02/14 0510  AST 31  ALT 17  ALKPHOS 57  BILITOT 0.8  PROT 7.2  ALBUMIN 3.2*   No results for input(s): LIPASE, AMYLASE in the last 168 hours. No results for input(s): AMMONIA in the last 168 hours. CBC:  Recent Labs Lab 10/01/14 1213 10/02/14 0510  WBC 10.1 11.5*  NEUTROABS 6.4 10.3*  HGB 11.0* 11.1*  HCT 34.3* 34.6*  MCV 93.7 93.0  PLT 194 195   Cardiac Enzymes:  Recent Labs Lab 10/01/14 1213  TROPONINI <0.03   BNP: Invalid input(s): POCBNP CBG:  Recent Labs Lab 10/01/14 1843 10/01/14 2134 10/02/14 0740  GLUCAP 357* 405* 222*    Recent Results (from the past 240 hour(s))  Culture, expectorated sputum-assessment     Status: None   Collection Time: 10/01/14  8:00 PM  Result Value Ref Range Status   Specimen Description SPUTUM  Final   Special Requests NONE  Final   Sputum evaluation   Final    MICROSCOPIC FINDINGS SUGGEST THAT THIS SPECIMEN IS NOT REPRESENTATIVE OF LOWER RESPIRATORY SECRETIONS. PLEASE RECOLLECT. NOTIFIED J.CLARK,RN AT 2354 ON 10/01/14 BY W.SHEA    Report Status 10/01/2014 FINAL  Final     Scheduled Meds: . aspirin EC  81 mg Oral Q breakfast  . atenolol  50 mg Oral Q breakfast  . dexamethasone  4 mg Intravenous 3 times per day  . enoxaparin (LOVENOX) injection  40 mg Subcutaneous Q24H  . furosemide  40 mg Oral Daily  . guaiFENesin  1,200 mg Oral BID  . insulin aspart  0-15 Units Subcutaneous TID WC & HS  . insulin glargine  18 Units Subcutaneous QHS  . ipratropium-albuterol  3 mL Nebulization Q6H  .  (LEVAQUIN) IV  750 mg Intravenous Q48H  . multivitamin with minerals  1 tablet Oral  Daily   Continuous Infusions: . sodium chloride    . sodium chloride 50 mL/hr at 10/01/14 1841

## 2014-10-03 ENCOUNTER — Inpatient Hospital Stay (HOSPITAL_COMMUNITY): Payer: Medicare Other

## 2014-10-03 LAB — GLUCOSE, CAPILLARY
GLUCOSE-CAPILLARY: 270 mg/dL — AB (ref 70–99)
Glucose-Capillary: 325 mg/dL — ABNORMAL HIGH (ref 70–99)
Glucose-Capillary: 259 mg/dL — ABNORMAL HIGH (ref 70–99)
Glucose-Capillary: 193 mg/dL — ABNORMAL HIGH (ref 70–99)
Glucose-Capillary: 232 mg/dL — ABNORMAL HIGH (ref 70–99)

## 2014-10-03 MED ORDER — INSULIN ASPART 100 UNIT/ML ~~LOC~~ SOLN
5.0000 [IU] | Freq: Three times a day (TID) | SUBCUTANEOUS | Status: DC
  Administered 2014-10-03 – 2014-10-04 (×4): 5 [IU] via SUBCUTANEOUS

## 2014-10-03 MED ORDER — METHYLPREDNISOLONE SODIUM SUCC 40 MG IJ SOLR
40.0000 mg | Freq: Every day | INTRAMUSCULAR | Status: DC
  Administered 2014-10-04: 40 mg via INTRAVENOUS
  Filled 2014-10-03: qty 1

## 2014-10-03 MED ORDER — INSULIN GLARGINE 100 UNIT/ML ~~LOC~~ SOLN
30.0000 [IU] | Freq: Every day | SUBCUTANEOUS | Status: DC
  Administered 2014-10-03: 30 [IU] via SUBCUTANEOUS
  Filled 2014-10-03 (×2): qty 0.3

## 2014-10-03 MED ORDER — INSULIN ASPART 100 UNIT/ML ~~LOC~~ SOLN
0.0000 [IU] | Freq: Every day | SUBCUTANEOUS | Status: DC
  Administered 2014-10-03: 2 [IU] via SUBCUTANEOUS

## 2014-10-03 MED ORDER — HYDRALAZINE HCL 20 MG/ML IJ SOLN: 5.0000 mg | Freq: Four times a day (QID) | INTRAMUSCULAR | Status: DC | PRN

## 2014-10-03 MED ORDER — INSULIN ASPART 100 UNIT/ML ~~LOC~~ SOLN
0.0000 [IU] | Freq: Three times a day (TID) | SUBCUTANEOUS | Status: DC
  Administered 2014-10-03 – 2014-10-04 (×3): 4 [IU] via SUBCUTANEOUS

## 2014-10-03 NOTE — Progress Notes (Signed)
Inpatient Diabetes Program Recommendations  AACE/ADA: New Consensus Statement on Inpatient Glycemic Control (2013)  Target Ranges:  Prepandial:   less than 140 mg/dL      Peak postprandial:   less than 180 mg/dL (1-2 hours)      Critically ill patients:  140 - 180 mg/dL     Results for Diana Clayton, Diana Clayton (MRN [id number]) as of 10/03/2014 09:39  Ref. Range 10/02/2014 07:40 10/02/2014 11:35 10/02/2014 13:16 10/02/2014 16:17 10/02/2014 21:28  Glucose-Capillary Latest Range: 70-99 mg/dL 213222 (H) 086280 (H) 578316 (H) 268 (H) 253 (H)    Results for Diana Clayton, Diana Clayton (MRN [id number]) as of 10/03/2014 09:39  Ref. Range 10/03/2014 07:48  Glucose-Capillary Latest Range: 70-99 mg/dL 413325 (H)    Results for Diana Clayton, Diana Clayton (MRN [id number]) as of 10/03/2014 09:39  Ref. Range 10/02/2014 05:10  Hgb A1c MFr Bld Latest Range: <5.7 % 7.1 (H)     Current Orders: Lantus 18 units QHS     Novolog Moderate SSI   Home DM Meds: 70/30 insulin- 34 units AM/ 20 units PM   **Note patient currently receiving IV Solumedrol 60 mg Q12 hours (started yest at 4pm)  **Glucose levels significantly elevated  **Eating 100% of meals  **A1c shows decent glucose control at home with 70/30 insulin    MD- Please consider the following to help improve glucose control:  1) Increase Lantus to 30 units QHS  (Patient receives approximately 38 units long-acting insulin in her doses of 70/30 insulin at home- Lantus 30 units QHS would be about 80% of the long-acting insulin dose she gets at home)  2) Add Novolog Meal Coverage- Novolog 3 units tid with meals   Will follow Ambrose FinlandJeannine Johnston Jhon Mallozzi RN, MSN, CDE Diabetes Coordinator Inpatient Diabetes Program Team Pager: [phone] (8a-10p)

## 2014-10-03 NOTE — Progress Notes (Addendum)
Patient ID: Diana Clayton, female   DOB: 1927-08-08, 81 y.o.   MRN: [id number] [facility] PROGRESS NOTE  Jilleen Essner NFA:[id number] DOB: [birth year] DOA: 10/01/2014 PCP: Beverley Fiedler, MD  Brief narrative:    80 year old female with diabetes mellitus type 2, hypertension who presented to [facility] ED with worsening shortness of breath and cough productive of yellow sputum for past 2 weeks prior to this admission. Chest x-ray on this admission showed no edema or consultation. CT chest showed enlarged left thyroid lobe worrisome for large left thyroid mass. Also seen were 2 left pulmonary nodules, larger is 12 mm in the left upper lobe. Spoke with oncology on call who recommended outpatient follow-up with PET scan. Thyroid US showed enlarged left lobe with 2 discrete nodules. Findings meet consensus criteria for biopsy. She underwent left mid thyroid lobe biopsy 10/03/2014.   Assessment/Plan:     Active Problems: Acute bronchitis / shortness of breath / leukocytosis - VQ scan showed very low probability for pulmonary embolism. Chest x-ray did not show edema or consolidation. CT scan significant for thyroid lobe mass as well as 2 pulmonary nodules. - Patient started empirically on Levaquin. - she reports feeling better this am. She appeared to nursing staff confused last night but pt daughter says it is because she did not apparently received breathing treatment on time. As mentioned above, she feels better. - I spoke with oncology who recommended outpatient PET scan. Recommendation was for ultrasound of the thyroid gland as well as biopsy for further evaluation. This was all done 10/03/2014. Will follow up on results.  - continue solumedrol 60 mg IV daily instead of Q 12 hours. - continue nebulizer treatments scheduled and as needed.  Left thyroid mass -  S/p [place] and biopsy by IR on 10/03/2014. Appreciate IR assistance.  DM2 (diabetes mellitus, type 2) - A1c is 7.1, indicating good glycemic  control. - Continue Lantus 30 units at bedtime along with sliding scale insulin. Added novolog 5 units TIDAC. - CBG's in past 24 hours: 270, 325, 253 - appreciate diabetic coordinator consult and recommendations.   Essential hypertension - Continue atenolol 50 mg daily. Hold Lasix because of acute renal failure.  Acute renal failure - Perhaps because of Lasix. Lasix on hold. - Follow-up BMP in the morning.   DVT Prophylaxis   Lovenox subcutaneous ordered  Code Status: Full.  Family Communication: plan of care discussed with the patient Disposition Plan: Home when stable.   IV access:  Peripheral IV  Procedures and diagnostic studies:   Dg Chest 2 View 10/01/2014 No edema or consolidation. Electronically Signed By: Bretta Bang M.D. On: 10/01/2014 12:24   Ct Chest W Contrast 10/01/2014 Enlarged left thyroid lobe worrisome for a large left thyroid mass. Thyroid ultrasound is recommended. This could potentially contribute to breathing difficulties. There are 2 left pulmonary nodules. The larger is 12 mm in the left upper lobe. Malignancy is not excluded. PET-CT is recommended.   Nm Pulmonary Perf And Vent 10/01/2014 No appreciable ventilation or perfusion defects. Very low probability of pulmonary embolus.   US Soft Tissue Head/neck 1/20/20161. Enlarged left lobe with 2 discrete nodules. Findings meet consensus criteria for biopsy. Ultrasound-guided fine needle aspiration recommended by the consensus statement: Management of Thyroid Nodules Detected at [place]: Society of Radiologists in Ultrasound Consensus Conference Statement. Radiology 2005; [id number]. Biopsy is scheduled. 2. Small residual/recurrent tissue in the right thyroidectomy bed, containing small nodule.   Electronically Signed   By: Oley Balm M.D.   On: 10/03/2014 09:49  [place] Biopsy 10/03/2014  Technically successful ultrasound guided fine needle aspiration of dominant left mid lobe thyroid  nodule.    Medical Consultants:  Oncology - phone call only   Other Consultants:  Physical therapy  IAnti-Infectives:   Levaquin 10/01/2014 -->   Manson PasseyEVINE, Paylin Hailu, MD  [facility] Pager [phone]  If 7PM-7AM, please contact night-coverage [email or web address] Password [email or web address] 10/03/2014, 4:04 PM   LOS: 2 days    HPI/Subjective: No acute overnight events.  Objective: Filed Vitals:   10/03/14 0504 10/03/14 0736 10/03/14 1151 10/03/14 1436  BP: 150/68  165/69 141/59  Pulse: 82  80 75  Temp: 98.4 F (36.9 C)   98.3 F (36.8 C)  TempSrc: Oral   Oral  Resp: 18   18  Height:      Weight:      SpO2: 99% 96% 100%     Intake/Output Summary (Last 24 hours) at 10/03/14 1604 Last data filed at 10/03/14 0510  Gross per 24 hour  Intake   1730 ml  Output      0 ml  Net   1730 ml    Exam:   General:  Pt is alert,  not in acute distress  Cardiovascular: Regular rate and rhythm, S1/S2 (+)  Respiratory: no wheezing, no crackles, no rhonchi  Abdomen: Soft, non tender, non distended, bowel sounds present  Extremities: No edema, pulses DP and PT palpable bilaterally  Neuro: Grossly nonfocal  Data Reviewed: Basic Metabolic Panel:  Recent Labs Lab 10/01/14 1213 10/01/14 2230 10/02/14 0510  NA 137  --  138  K 3.7  --  4.1  CL 102  --  102  CO2 28  --  25  GLUCOSE 207* 416* 217*  BUN 18  --  25*  CREATININE 1.21*  --  1.51*  CALCIUM 8.3*  --  8.9  MG  --   --  1.7  PHOS  --   --  1.8*   Liver Function Tests:  Recent Labs Lab 10/02/14 0510  AST 31  ALT 17  ALKPHOS 57  BILITOT 0.8  PROT 7.2  ALBUMIN 3.2*   No results for input(s): LIPASE, AMYLASE in the last 168 hours. No results for input(s): AMMONIA in the last 168 hours. CBC:  Recent Labs Lab 10/01/14 1213 10/02/14 0510  WBC 10.1 11.5*  NEUTROABS 6.4 10.3*  HGB 11.0* 11.1*  HCT 34.3* 34.6*  MCV 93.7 93.0  PLT 194 195   Cardiac Enzymes:  Recent Labs Lab 10/01/14 1213  TROPONINI  <0.03   BNP: Invalid input(s): POCBNP CBG:  Recent Labs Lab 10/02/14 1316 10/02/14 1617 10/02/14 2128 10/03/14 0748 10/03/14 1039  GLUCAP 316* 268* 253* 325* 270*    Culture, expectorated sputum-assessment     Status: None   Collection Time: 10/01/14  8:00 PM  Result Value Ref Range Status   Specimen Description SPUTUM  Final   Special Requests NONE  Final   Sputum evaluation   Final    MICROSCOPIC FINDINGS SUGGEST THAT THIS SPECIMEN IS NOT REPRESENTATIVE OF LOWER RESPIRATORY SECRETIONS. PLEASE RECOLLECT. NOTIFIED J.CLARK,RN AT 2354 ON 10/01/14 BY W.SHEA    Report Status 10/01/2014 FINAL  Final     Scheduled Meds: . aspirin EC  81 mg Oral Q breakfast  . atenolol  50 mg Oral Q breakfast  .  enoxaparin injection  40 mg Subcutaneous Q24H  . guaiFENesin  1,200 mg Oral BID  . insulin aspart  0-20 Units Subcutaneous TID WC  . insulin aspart  0-5 Units Subcutaneous QHS  . insulin aspart  5 Units Subcutaneous TID WC  . insulin glargine  18 Units Subcutaneous QHS  . ipratropium-albuterol  3 mL Nebulization Q6H  . levofloxacin (LEVAQUIN) IV  750 mg Intravenous Q48H  .  methylPREDNISolone   40 mg Intravenous Daily  . multivitamin with miner  1 tablet Oral Daily  . pantoprazole  20 mg Oral Daily   Continuous Infusions: . sodium chloride    . sodium chloride 50 mL/hr at 10/01/14 1841

## 2014-10-03 NOTE — Evaluation (Addendum)
Physical Therapy Evaluation Patient Details Name: Diana Clayton MRN: [id number] DOB: 08-Aug-1927 Today's Date: 10/03/2014   History of Present Illness  Diana Clayton is a pleasant 81 year old female with diabetes mellitus type 2/central hypertension who comes in with increasing shortness of breath associated with cough productive of yellow colored sputum ongoing for the last 2 weeks and she is found to have acute bronchitis   Clinical Impression  *Pt admitted with above diagnosis. Pt currently with functional limitations due to the deficits listed below (see PT Problem List). ** Pt will benefit from skilled PT to increase their independence and safety with mobility to allow discharge to the venue listed below.    Pt ambulated 250' with RW in hall without LOB. Distance limited by fatigue. Audible wheezing noted with pt seated at rest prior to walking. No wheezing noted with walking. SaO2 100% on RA, HR 80 with walking.  **    Follow Up Recommendations Home health PT    Equipment Recommendations  None recommended by PT    Recommendations for Other Services       Precautions / Restrictions Precautions Precautions: Fall Restrictions Weight Bearing Restrictions: No      Mobility  Bed Mobility Overal bed mobility: Modified Independent             General bed mobility comments: with rail  Transfers Overall transfer level: Modified independent Equipment used: Rolling walker (2 wheeled)             General transfer comment: pushed up on bedrail  Ambulation/Gait Ambulation/Gait assistance: Supervision Ambulation Distance (Feet): 250 Feet Assistive device: Rolling walker (2 wheeled) Gait Pattern/deviations: WFL(Within Functional Limits)   Gait velocity interpretation: at or above normal speed for age/gender General Gait Details: steady with RW  Stairs            Wheelchair Mobility    Modified Rankin (Stroke Patients Only)       Balance Overall balance  assessment: Modified Independent                                           Pertinent Vitals/Pain Pain Assessment: No/denies pain    Home Living Family/patient expects to be discharged to:: Private residence   Available Help at Discharge: Family;Available PRN/intermittently (daughter lives across the street, but works) Type of Home: Apartment Home Access: Level entry       Home Equipment: Environmental consultant - 4 wheels;Cane - single point;Toilet riser;Grab bars - toilet;Grab bars - tub/shower;Shower seat      Prior Function Level of Independence: Independent with assistive device(s)         Comments: used rollator or cane     Hand Dominance        Extremity/Trunk Assessment   Upper Extremity Assessment: Overall WFL for tasks assessed           Lower Extremity Assessment: Overall WFL for tasks assessed      Cervical / Trunk Assessment: Normal  Communication   Communication: No difficulties  Cognition Arousal/Alertness: Awake/alert Behavior During Therapy: WFL for tasks assessed/performed Overall Cognitive Status: Within Functional Limits for tasks assessed                      General Comments      Exercises        Assessment/Plan    PT Assessment Patient needs continued PT services  PT  Diagnosis Generalized weakness   PT Problem List Decreased activity tolerance;Obesity;Decreased mobility  PT Treatment Interventions Gait training;Therapeutic exercise   PT Goals (Current goals can be found in the Care Plan section) Acute Rehab PT Goals Patient Stated Goal: return to independence with mobility PT Goal Formulation: With patient/family Time For Goal Achievement: 10/17/14 Potential to Achieve Goals: Good    Frequency Min 3X/week   Barriers to discharge        Co-evaluation               End of Session Equipment Utilized During Treatment: Gait belt Activity Tolerance: Patient tolerated treatment well Patient left: in  bed;with call bell/phone within reach;with family/visitor present Nurse Communication: Mobility status         Time: [id number] PT Time Calculation (min) (ACUTE ONLY): 22 min   Charges:   PT Evaluation $Initial PT Evaluation Tier I: 1 Procedure PT Treatments $Gait Training: 8-22 mins   PT G Codes:        Tamala SerUhlenberg, Jaymere Alen Kistler 10/03/2014, 12:18 PM [phone]

## 2014-10-03 NOTE — Procedures (Signed)
US reviewed by Dr. Deanne CofferHassell today and recommendation for FNA of dominant nodule. Successful FNA x 4 of left mid thyroid nodule measuring 28 x 29 x 33 mm. No immediate complications, dressing applied. Cytology ordered.  Pattricia BossKoreen Sophonie Goforth PA-C Interventional Radiology  10/03/14  10:00 AM

## 2014-10-03 NOTE — Care Management Note (Signed)
CARE MANAGEMENT NOTE 10/03/2014  Patient:  Diana Clayton, Diana Clayton   Account Number:  [id number]  Date Initiated:  10/03/2014  Documentation initiated by:  Marney Doctor  Subjective/Objective Assessment:   81 yo admitted with acute bronchitis     Action/Plan:   Lives with granddaughter   Anticipated DC Date:  10/05/2014   Anticipated DC Plan:  [place]  CM consult      [facility] Choice  HOME HEALTH   Choice offered to / List presented to:  C-4 Adult Children        HH arranged  HH-1 RN  [place]      Status of service:  In process, will continue to follow Medicare Important Message given?   (If response is "NO", the following Medicare IM given date fields will be blank) Date Medicare IM given:   Medicare IM given by:   Date Additional Medicare IM given:   Additional Medicare IM given by:    Discharge Disposition:    Per UR Regulation:  Reviewed for med. necessity/level of care/duration of stay  If discussed at [place] of Stay Meetings, dates discussed:    Comments:  10/03/14 Marney Doctor RN,BSN,NCM [phone] Met with pt and daughter Diana Clayton about DC planning. PT recommended HHPT.  Choice was offered to pt and daughter. Private duty list also given to daughter per her request. Daughter and pt to think about it and this CM will check back tomorrow.

## 2014-10-04 ENCOUNTER — Other Ambulatory Visit: Payer: Self-pay | Admitting: Radiology

## 2014-10-04 LAB — GLUCOSE, CAPILLARY
GLUCOSE-CAPILLARY: 173 mg/dL — AB (ref 70–99)
GLUCOSE-CAPILLARY: 95 mg/dL (ref 70–99)
GLUCOSE-CAPILLARY: 151 mg/dL — AB (ref 70–99)

## 2014-10-04 MED ORDER — HYDROCOD POLST-CHLORPHEN POLST 10-8 MG/5ML PO LQCR
5.0000 mL | Freq: Once | ORAL | Status: AC
  Administered 2014-10-04: 5 mL via ORAL
  Filled 2014-10-04: qty 5

## 2014-10-04 MED ORDER — HYDROCODONE-ACETAMINOPHEN 5-325 MG PO TABS: 1.0000 | ORAL_TABLET | ORAL | Status: DC | PRN

## 2014-10-04 MED ORDER — LEVOFLOXACIN 750 MG PO TABS: 750.0000 mg | ORAL_TABLET | Freq: Every day | ORAL | Status: DC

## 2014-10-04 MED ORDER — GUAIFENESIN ER 600 MG PO TB12: 1200.0000 mg | ORAL_TABLET | Freq: Two times a day (BID) | ORAL | Status: AC | PRN

## 2014-10-04 NOTE — Plan of Care (Signed)
Problem: Phase II Progression Outcomes Goal: O2 sats > equal to 90% on RA or at baseline Outcome: Completed/Met Date Met:  10/04/14 Patient sating high 90's on room air.

## 2014-10-04 NOTE — Progress Notes (Signed)
Discharge instructions reviewed with patient and her daughter using teach back method and they demonstrated understanding.  Patient stable for discharge home.  Assessment unchanged from this am.  Prescriptions called into [facility].  Patient does [place] have a PCP [place]  Patient's daughter stated she was already in the process of getting her mother a PCP and I educated on the importance of doing this quickly so that the patient can get a follow up appointment to find out thyroid biopsy results.  Patient's daughter verbalized understanding.  Allayne ButcherMiller, Leesa Leifheit [facility]  10/04/2014

## 2014-10-04 NOTE — Care Management Note (Signed)
CARE MANAGEMENT NOTE 10/04/2014  Patient:  Diana Clayton, Diana Clayton   Account Number:  [id number]  Date Initiated:  10/03/2014  Documentation initiated by:  Marney Doctor  Subjective/Objective Assessment:   81 yo admitted with acute bronchitis     Action/Plan:   Lives with granddaughter   Anticipated DC Date:  10/04/2014   Anticipated DC Plan:  [place]  CM consult      [facility] Choice  HOME HEALTH   Choice offered to / List presented to:  C-4 Adult Children   DME arranged  [facility]      DME agency  [place] arranged  HH-1 RN  [place] [facility]      [facility] agency  [place]   Status of service:  In process, will continue to follow Medicare Important Message given?  YES (If response is "NO", the following Medicare IM given date fields will be blank) Date Medicare IM given:  10/04/2014 Medicare IM given by:  Marney Doctor Date Additional Medicare IM given:   Additional Medicare IM given by:    Discharge Disposition:    Per UR Regulation:  Reviewed for med. necessity/level of care/duration of stay  If discussed at [place] of Stay Meetings, dates discussed:    Comments:  10/04/14 Marney Doctor RN,BSN,NCM Spoke with Daughter Mardene Celeste for decision of choice. [facility] chosen and [place] rep called to give referral. [facility] also ordered for pt. [facility] rep called to order [facility]. No other CM needs noted  10/03/14 Marney Doctor RN,BSN,NCM [phone] Met with pt and daughter Mardene Celeste about DC planning. PT recommended HHPT.  Choice was offered to pt and daughter. Private duty list also given to daughter per her request. Daughter and pt to think about it and this CM will check back tomorrow.

## 2014-10-05 NOTE — Discharge Summary (Addendum)
Physician Discharge Summary  [place] NGE:[id number] DOB: 09/22/1926 DOA: 10/01/2014  PCP: Beverley Fiedler, MD  Admit date: 10/01/2014 Discharge date: 10/04/2014  Recommendations for Outpatient Follow-up:  Please follow up with PCP for results on thyroid biopsy. Take Levaquin for 5 days on discharge. Please consider holding lasix due to acute renal failure. When kidney function rechecked by PCP and if stable or better may resume lasix.  Discharge Diagnoses:  Active Problems:   Acute bronchitis   DM2 (diabetes mellitus, type 2)   Essential hypertension   Thyroid mass   Acute renal failure syndrome    Discharge Condition: stable   Diet recommendation: as tolerated   History of present illness:  81 year old female with diabetes mellitus type 2, hypertension who presented to [facility] ED with worsening shortness of breath and cough productive of yellow sputum for past 2 weeks prior to this admission. Chest x-ray on this admission showed no edema or consultation. CT chest showed enlarged left thyroid lobe worrisome for large left thyroid mass. Also seen were 2 left pulmonary nodules, larger is 12 mm in the left upper lobe. Spoke with oncology on call who recommended outpatient follow-up with PET scan. Thyroid US showed enlarged left lobe with 2 discrete nodules. Findings meet consensus criteria for biopsy. She underwent left mid thyroid lobe biopsy 10/03/2014.  Assessment/Plan:    Active Problems: Acute bronchitis / shortness of breath / leukocytosis - VQ scan showed very low probability for pulmonary embolism. Chest x-ray did not show edema or consolidation. CT scan significant for thyroid lobe mass as well as 2 pulmonary nodules. - because of concern for possible bronchitis we started Levaquin and she will continue this for 5 days on discharge. - pt does not need prednisone on discharge.  - she can continue albuterol inhaler and nebulizer per home regimen  Left thyroid mass - S/p  [place] and biopsy by IR on 10/03/2014. Pt will have follow up PCP who can review pathology results. Patient's daughter aware of pending results. - I spoke with oncology who recommended outpatient PET scan which was also communicated to the patient's daughter   DM2 (diabetes mellitus, type 2) - A1c is 7.1, indicating good glycemic control. - Continue current home insulin regimen - pt was seen by diabetic coordinator in hospital   Essential hypertension - Continue atenolol 50 mg daily. Hold Lasix because of acute renal failure.  Acute renal failure - Perhaps because of Lasix. Lasix on hold.   DVT Prophylaxis   Lovenox subcutaneous ordered while pt in hospital   Code Status: Full.  Family Communication: plan of care discussed with the patient and her daughter at bedside   IV access:  Peripheral IV  Procedures and diagnostic studies:   Dg Chest 2 View 10/01/2014 No edema or consolidation. Electronically Signed By: Bretta Bang M.D. On: 10/01/2014 12:24   Ct Chest W Contrast 10/01/2014 Enlarged left thyroid lobe worrisome for a large left thyroid mass. Thyroid ultrasound is recommended. This could potentially contribute to breathing difficulties. There are 2 left pulmonary nodules. The larger is 12 mm in the left upper lobe. Malignancy is not excluded. PET-CT is recommended.   Nm Pulmonary Perf And Vent 10/01/2014 No appreciable ventilation or perfusion defects. Very low probability of pulmonary embolus.   US Soft Tissue Head/neck 1/20/20161. Enlarged left lobe with 2 discrete nodules. Findings meet consensus criteria for biopsy. Ultrasound-guided fine needle aspiration recommended by the consensus statement: Management of Thyroid Nodules Detected at [place]: Society of Radiologists in Ultrasound Consensus Conference Statement.  Radiology 2005; [id number]. Biopsy is scheduled. 2. Small residual/recurrent tissue in the right thyroidectomy bed, containing small nodule.  Electronically Signed By: Oley Balmaniel Hassell M.D. On: 10/03/2014 09:49   [place] Biopsy 10/03/2014 Technically successful ultrasound guided fine needle aspiration of dominant left mid lobe thyroid nodule.   Medical Consultants:  Oncology - phone call only   Other Consultants:  Physical therapy  IAnti-Infectives:   Levaquin 10/01/2014 -->    Signed:  Manson PasseyEVINE, Deondra Wigger, MD  [facility] 10/05/2014, 6:14 PM  Pager #: [phone]   Discharge Exam: Filed Vitals:   10/04/14 1335  BP: 133/61  Pulse: 70  Temp: 98.2 F (36.8 C)  Resp: 20   Filed Vitals:   10/04/14 0720 10/04/14 1143 10/04/14 1335 10/04/14 1518  BP:   133/61   Pulse:   70   Temp:   98.2 F (36.8 C)   TempSrc:   Oral   Resp:   20   Height:      Weight:      SpO2: 99% 95% 98% 97%    General: Pt is not in acute distress Cardiovascular: RRR, S1/S2 (+) Respiratory: no wheezing, no crackles, no rhonchi Extremities: no edema, no cyanosis, pulses palpable bilaterally DP and PT Neuro: nonfocal  Discharge Instructions  Discharge Instructions    Call MD for:  difficulty breathing, headache or visual disturbances    Complete by:  As directed      Call MD for:  persistant dizziness or light-headedness    Complete by:  As directed      Call MD for:  persistant nausea and vomiting    Complete by:  As directed      Call MD for:  severe uncontrolled pain    Complete by:  As directed      Diet - low sodium heart healthy    Complete by:  As directed      Discharge instructions    Complete by:  As directed   Please follow up with PCP for results on thyroid biopsy. Take Levaquin for 5 days on discharge. Please consider holding lasix due to acute renal failure. When kidney function rechecked by PCP and if stable or better may resume lasix.     Increase activity slowly    Complete by:  As directed             Medication List    STOP taking these medications        furosemide 40 MG tablet   Commonly known as:  LASIX     predniSONE 20 MG tablet  Commonly known as:  DELTASONE      TAKE these medications        albuterol 108 (90 BASE) MCG/ACT inhaler  Commonly known as:  PROVENTIL HFA;VENTOLIN HFA  Inhale 2 puffs into the lungs every 6 (six) hours as needed for wheezing or shortness of breath.     albuterol (2.5 MG/3ML) 0.083% nebulizer solution  Commonly known as:  PROVENTIL  Take 3 mLs (2.5 mg total) by nebulization every 6 (six) hours as needed for wheezing or shortness of breath.     aspirin EC 81 MG tablet  Take 81 mg by mouth daily with breakfast.     atenolol 50 MG tablet  Commonly known as:  TENORMIN  Take 50 mg by mouth daily with breakfast.     CLEAR EYES FOR DRY EYES 1-0.25 % Soln  Generic drug:  Carboxymethylcellul-Glycerin  Place 1 drop into both eyes daily. For allergies and  dry eyes     Fish Oil 600 MG Caps  Take 600 mg by mouth daily with breakfast.     GERITOL COMPLETE Tabs  Take 1 tablet by mouth daily.     guaiFENesin 600 MG 12 hr tablet  Commonly known as:  MUCINEX  Take 2 tablets (1,200 mg total) by mouth 2 (two) times daily as needed.     HYDROcodone-acetaminophen 5-325 MG per tablet  Commonly known as:  NORCO/VICODIN  Take 1-2 tablets by mouth every 4 (four) hours as needed for moderate pain.     insulin NPH-regular Human (70-30) 100 UNIT/ML injection  Commonly known as:  NOVOLIN 70/30  Inject 20-34 Units into the skin 2 (two) times daily with a meal. Takes 34 units in the morning and 20 units at night     levofloxacin 750 MG tablet  Commonly known as:  LEVAQUIN  Take 1 tablet (750 mg total) by mouth daily.          The results of significant diagnostics from this hospitalization (including imaging, microbiology, ancillary and laboratory) are listed below for reference.    Significant Diagnostic Studies: Dg Chest 2 View  10/01/2014   CLINICAL DATA:  Cough and difficulty breathing  EXAM: CHEST  2 VIEW  COMPARISON:  September 19, 2014  FINDINGS: There is no edema or consolidation. Heart is upper normal in size with pulmonary vascularity within normal limits. No adenopathy. No bone lesions.  IMPRESSION: No edema or consolidation.   Electronically Signed   By: Bretta Bang M.D.   On: 10/01/2014 12:24   Dg Chest 2 View  09/19/2014   CLINICAL DATA:  Shortness of breath.  Cough.  Dyspnea.  EXAM: CHEST  2 VIEW  COMPARISON:  None.  FINDINGS: Low lung volumes are noted, however both lungs are clear. No evidence of pleural effusion. Heart size is within normal limits.  IMPRESSION: Low lung volumes.  No active disease.   Electronically Signed   By: Myles Rosenthal M.D.   On: 09/19/2014 08:25   Ct Chest W Contrast  10/01/2014   CLINICAL DATA:  Short of breath.  Wheezing.  Symptoms for 2 weeks.  EXAM: CT CHEST WITH CONTRAST  TECHNIQUE: Multidetector CT imaging of the chest was performed during intravenous contrast administration.  CONTRAST:  OMNIPAQUE IOHEXOL 300 MG/ML  SOLN  COMPARISON:  None.  FINDINGS: The left lobe of the thyroid gland is massively enlarged and heterogeneous with areas of low density and calcification. A large left thyroid nodule may be present.  No abnormal mediastinal adenopathy.  No pericardial effusion.  Central left upper lobe 12 mm pulmonary nodule on image 27. 4 mm left lower lobe pulmonary nodule best seen on sagittal reconstruction images. See image 84 of series 603.  No pneumothorax or pleural effusion.  IMPRESSION: Enlarged left thyroid lobe worrisome for a large left thyroid mass. Thyroid ultrasound is recommended. This could potentially contribute to breathing difficulties.  There are 2 left pulmonary nodules. The larger is 12 mm in the left upper lobe. Malignancy is not excluded. PET-CT is recommended.   Electronically Signed   By: Maryclare Bean M.D.   On: 10/01/2014 21:05   US Soft Tissue Head/neck  10/03/2014   CLINICAL DATA:  previous right hemithyroidectomy. left thyroid mass noted on recent CT chest.   EXAM: THYROID ULTRASOUND  TECHNIQUE: Ultrasound examination of the thyroid gland and adjacent soft tissues was performed.  COMPARISON:  CT 10/01/2014  FINDINGS: Right thyroid lobe  Measurements:  16 x 9 x 8 mm. 6 x 4 x 5 mm complex hypoechoic nodule, deep mid.  Left thyroid lobe  Measurements: 72 x 43 x 42 mm. Inhomogeneous background echotexture. Dominant 28 x 29 x 33 mm nodule, mid lobe. 23 x 18 x 21 mm solid nodule, superior pole.  Isthmus  Thickness: 4.3 mm. 7 x 5 x 6 mm complex cystic nodule, right of midline.  Lymphadenopathy  None visualized.  IMPRESSION: 1. Enlarged left lobe with 2 discrete nodules. Findings meet consensus criteria for biopsy. Ultrasound-guided fine needle aspiration recommended by the consensus statement: Management of Thyroid Nodules Detected at [place]: Society of Radiologists in Ultrasound Consensus Conference Statement. Radiology 2005; [id number]. Biopsy is scheduled. 2. Small residual/recurrent tissue in the right thyroidectomy bed, containing small nodule.   Electronically Signed   By: Oley Balm M.D.   On: 10/03/2014 09:49   Nm Pulmonary Perf And Vent  10/01/2014   CLINICAL DATA:  Shortness of breath and chest pressure  EXAM: NUCLEAR MEDICINE VENTILATION - PERFUSION LUNG SCAN  Views: Anterior, posterior, left lateral, right lateral, RPO, LPO, RAO, LAO -ventilation and perfusion  Radionuclide: Technetium 61m DTPA -ventilation; Technetium 48m macroaggregated albumin -perfusion  Dose:  37.0 mCi-ventilation; 5.3 mCi-perfusion  Route of administration: Inhalation-ventilation; intravenous -perfusion  COMPARISON:  Chest radiograph October 01, 2014  FINDINGS: Ventilation: Radiotracer uptake is homogeneous and symmetric bilaterally. There are no appreciable ventilation defects.  Perfusion: Radiotracer uptake is homogeneous and symmetric bilaterally. There are no appreciable perfusion defects.  IMPRESSION: No appreciable ventilation or perfusion defects. Very low probability of pulmonary  embolus.   Electronically Signed   By: Bretta Bang M.D.   On: 10/01/2014 14:57   US Biopsy  10/03/2014   INDICATION: History of previous right hemithyroidectomy, now with left indeterminate thyroid nodule, request for FNA of dominant left mid pole nodule.  EXAM: ULTRASOUND GUIDED FINE NEEDLE ASPIRATION OF INDETERMINATE THYROID NODULE  COMPARISON:  US soft tissue head/neck 10/03/14  MEDICATIONS: None  COMPLICATIONS: None immediate  TECHNIQUE: Informed written consent was obtained from the patient after a discussion of the risks, benefits and alternatives to treatment. Questions regarding the procedure were encouraged and answered. A timeout was performed prior to the initiation of the procedure.  Pre-procedural ultrasound scanning demonstrated dominant 28 x 29 x 33 mm nodule, left mid lobe.  The procedure was planned. The neck was prepped in the usual sterile fashion, and a sterile drape was applied covering the operative field. A timeout was performed prior to the initiation of the procedure. Local anesthesia was provided with 1% lidocaine.  Under direct ultrasound guidance, 4 FNA biopsies were performed of the dominant left mid thyroid nodule with a 25 gauge needle. The samples were prepared and submitted to pathology.  Limited post procedural scanning was negative for hematoma or additional complication. Dressings were placed. The patient tolerated the above procedures procedure well without immediate postprocedural complication.  IMPRESSION: Technically successful ultrasound guided fine needle aspiration of dominant left mid lobe thyroid nodule.  Read By:  Pattricia Boss PA-C   Electronically Signed   By: Oley Balm M.D.   On: 10/03/2014 10:14    Microbiology: Recent Results (from the past 240 hour(s))  Culture, expectorated sputum-assessment     Status: None   Collection Time: 10/01/14  8:00 PM  Result Value Ref Range Status   Specimen Description SPUTUM  Final   Special Requests NONE  Final    Sputum evaluation   Final    MICROSCOPIC FINDINGS SUGGEST  THAT THIS SPECIMEN IS NOT REPRESENTATIVE OF LOWER RESPIRATORY SECRETIONS. PLEASE RECOLLECT. NOTIFIED J.CLARK,RN AT 2354 ON 10/01/14 BY W.SHEA    Report Status 10/01/2014 FINAL  Final     Labs: Basic Metabolic Panel:  Recent Labs Lab 10/01/14 1213 10/01/14 2230 10/02/14 0510  NA 137  --  138  K 3.7  --  4.1  CL 102  --  102  CO2 28  --  25  GLUCOSE 207* 416* 217*  BUN 18  --  25*  CREATININE 1.21*  --  1.51*  CALCIUM 8.3*  --  8.9  MG  --   --  1.7  PHOS  --   --  1.8*   Liver Function Tests:  Recent Labs Lab 10/02/14 0510  AST 31  ALT 17  ALKPHOS 57  BILITOT 0.8  PROT 7.2  ALBUMIN 3.2*   No results for input(s): LIPASE, AMYLASE in the last 168 hours. No results for input(s): AMMONIA in the last 168 hours. CBC:  Recent Labs Lab 10/01/14 1213 10/02/14 0510  WBC 10.1 11.5*  NEUTROABS 6.4 10.3*  HGB 11.0* 11.1*  HCT 34.3* 34.6*  MCV 93.7 93.0  PLT 194 195   Cardiac Enzymes:  Recent Labs Lab 10/01/14 1213  TROPONINI <0.03   BNP: BNP (last 3 results) No results for input(s): PROBNP in the last 8760 hours. CBG:  Recent Labs Lab 10/03/14 1638 10/03/14 2053 10/04/14 0741 10/04/14 1207 10/04/14 1554  GLUCAP 193* 232* 173* 95 151*    Time coordinating discharge: Over 30 minutes

## 2014-10-10 ENCOUNTER — Telehealth: Payer: Self-pay | Admitting: Family Medicine

## 2014-10-10 DIAGNOSIS — E119 Type 2 diabetes mellitus without complications: Secondary | ICD-10-CM

## 2014-10-10 NOTE — Telephone Encounter (Signed)
Joyce GrossKay called and will be following this patient who is new to us. Dr. Ellin Goodieivine had put orders in for skilled nursing, PT, and OT. But they need a PCP to give the orders. Joyce GrossKay has seen the patient twice this week and really needs this in place. Her blood Sugar yesterday was over 300. Dr. Caroleen Hammanumley will be her PCP. Please call Joyce GrossKay with the orders because Dr. Ellin Goodieivine feels that she would end up back in the hospital without this care. Please call Joyce GrossKay at [phone]. jw

## 2014-10-12 NOTE — Telephone Encounter (Signed)
Order placed for PT, OT, Skilled Nursing

## 2014-10-15 NOTE — Telephone Encounter (Signed)
Verbal order given. Will obtain diabetes education at home. Will follow up at office visit next week.

## 2014-10-19 ENCOUNTER — Ambulatory Visit (INDEPENDENT_AMBULATORY_CARE_PROVIDER_SITE_OTHER): Payer: Medicare Other | Admitting: Family Medicine

## 2014-10-19 ENCOUNTER — Encounter: Payer: Self-pay | Admitting: Family Medicine

## 2014-10-19 VITALS — BP 140/66 | HR 79 | Temp 97.5°F | Ht 69.5 in | Wt 240.3 lb

## 2014-10-19 DIAGNOSIS — J40 Bronchitis, not specified as acute or chronic: Secondary | ICD-10-CM

## 2014-10-19 DIAGNOSIS — R0689 Other abnormalities of breathing: Secondary | ICD-10-CM | POA: Diagnosis not present

## 2014-10-19 DIAGNOSIS — E119 Type 2 diabetes mellitus without complications: Secondary | ICD-10-CM

## 2014-10-19 DIAGNOSIS — E079 Disorder of thyroid, unspecified: Secondary | ICD-10-CM

## 2014-10-19 DIAGNOSIS — I10 Essential (primary) hypertension: Secondary | ICD-10-CM

## 2014-10-19 MED ORDER — ASPIRIN EC 81 MG PO TBEC: 81.0000 mg | DELAYED_RELEASE_TABLET | Freq: Every day | ORAL | Status: DC

## 2014-10-19 MED ORDER — ALBUTEROL SULFATE HFA 108 (90 BASE) MCG/ACT IN AERS: 2.0000 | INHALATION_SPRAY | Freq: Four times a day (QID) | RESPIRATORY_TRACT | Status: DC | PRN

## 2014-10-19 MED ORDER — INSULIN NPH ISOPHANE & REGULAR (70-30) 100 UNIT/ML ~~LOC~~ SUSP: 20.0000 [IU] | Freq: Two times a day (BID) | SUBCUTANEOUS | Status: DC

## 2014-10-19 MED ORDER — IPRATROPIUM BROMIDE 0.02 % IN SOLN
0.5000 mg | Freq: Once | RESPIRATORY_TRACT | Status: AC
  Administered 2014-10-19: 0.5 mg via RESPIRATORY_TRACT

## 2014-10-19 MED ORDER — GLUCOSE BLOOD VI STRP: ORAL_STRIP | Status: DC

## 2014-10-19 MED ORDER — ATENOLOL 50 MG PO TABS: 50.0000 mg | ORAL_TABLET | Freq: Every day | ORAL | Status: DC

## 2014-10-19 MED ORDER — ALBUTEROL SULFATE (2.5 MG/3ML) 0.083% IN NEBU
2.5000 mg | INHALATION_SOLUTION | Freq: Once | RESPIRATORY_TRACT | Status: AC
  Administered 2014-10-19: 2.5 mg via RESPIRATORY_TRACT

## 2014-10-19 NOTE — Patient Instructions (Addendum)
Thank you so much for coming to visit me today1 I have sent in refills of all of your medications. Please STOP taking Lasix. I will contact the Pathologist next week to check on the results of your thyroid biopsy. I will contact you once I know the results.  Please schedule an appointment with Paulino RilyPete Koval for PFT testing so we can get a baseline on your lung function.  Please schedule an appointment for two weeks so we can check on how you are doing without your lasix. Please bring a copy of your blood sugar to this visit so we can adjust your insulin dose if needed. We will also check labs at this visit.  Thanks again! Dr. Caroleen Hammanumley

## 2014-10-20 NOTE — Assessment & Plan Note (Signed)
-   Unsure of pathology results. Will contact pathology on 1/8 for clarification. Will contact Mrs. Seiler to discuss results and discuss blood sugar.

## 2014-10-20 NOTE — Progress Notes (Signed)
Subjective:     Patient ID: Diana RosenthalAugusta Clayton, female   DOB: Mar 21, 1927, 81 y.o.   MRN: [id number]  HPI Diana Clayton presents for hospital follow up and for new patient visit. - Was recently in hospital for bronchitis - Shortness of breath has improved, but she is still wheezing - States she has been wheezing for at least one year - Needs refill on albuterol inhaler. Daughter feels that she might not be using inhaler correctly - Sugars drop low overnight. Lowest a few weeks ago in 40s. - Scheduled to see ophthalmologist - Follows with podiatry - Recently had thyroid biopsy in hospital - Concerned that she is taking too much lasix. States she feels like skin is getting too dry. - Has RN that comes by three times a week. PT two times a week. OT two times a week.  - PMH: Diabetes, HTN, Edema - Meds: Atenolol 50mg , Novolin 70/30 34units in am and 20units in pm, Fish Oil, Aspirin 81mg , Furosemide 40mg , Albuterol inhaler - Family History: HTN, DM, Colon Cancer (mother), Lung Cancer (Brother), Deression, Schizophrenia, Bipolar - Social History: lives alone but daughter in same apartment complex, denies alcohol use, quit smoking when 81yo and started when 81yo.  Review of Systems  Respiratory: Positive for shortness of breath and wheezing.   Cardiovascular: Negative for chest pain.       Objective:   Physical Exam  Constitutional: She is oriented to person, place, and time. She appears well-developed and well-nourished. No distress.  Cardiovascular: Normal rate and regular rhythm.  Exam reveals no gallop and no friction rub.   No murmur heard. Pulmonary/Chest: Effort normal. No respiratory distress. She has no rales.  Expiratory wheeze noted. Wheeze still present but improved following duoneb treatment  Abdominal: Soft. Bowel sounds are normal. She exhibits no distension. There is no tenderness.  Musculoskeletal: She exhibits no edema or tenderness.  Neurological: She is alert and oriented to  person, place, and time.  Psychiatric: She has a normal mood and affect. Her behavior is normal.       Assessment:     Please refer to Problem List for Assessment.    Plan:     Please refer to Problem List for Plan.

## 2014-10-20 NOTE — Assessment & Plan Note (Signed)
-   Currently on Novolin 70/30 34units in am and 20units in pm - Recommended writing down all CBGs and bringing it with her to her next visit - Concerned about periods of hypoglycemia. Stressed importance of bed time snacks to prevent hypoglycemia at night. Planning to contact on 2/8 to discuss thyroid biopsy results; will follow up blood sugars so far and adjust night time dose if indicated. - Discussed plan if hypoglycemia develops - RN coming by three times/week  - Will further discuss in 2 weeks with blood sugar logs

## 2014-10-20 NOTE — Assessment & Plan Note (Signed)
-   Currently taking Atenolol 50mg  and Lasix 40mg  - Was instructed at recent discharge from hospital to stop taking Lasix so labs could be checked at next office visit. Will discontinue lasix at this time and recheck labs at next visit in 2 weeks - Blood pressure 140/66 today. Consider appropriate for age.

## 2014-10-20 NOTE — Assessment & Plan Note (Signed)
-   Reports one year+ history of wheezing - Course of antibiotics completed during hospitalization - Duoneb treatment given. Wheezing still present but improved - Follow up with Pharmacy for PFT and counseling on proper inhaler use - Needed refill of inhaler. Prescription sent to pharmacy - Discussed that if shortness of breath worsens to go to emergency room. In cases of emergency, call 911. - Follow up in two weeks

## 2014-10-23 ENCOUNTER — Telehealth: Payer: Self-pay | Admitting: Family Medicine

## 2014-10-23 NOTE — Telephone Encounter (Signed)
[facility] and the patient needs refills on her test strips. The prescription has to have the ICD-10 code on it and also how many times a day she needs to check. They are also requesting a insulin pen if possible because the patient has a very hard time seeing and this would help her manage her insulin right now the patient is doing 34 in the morning and 20 at night. [facility] [address] jw

## 2014-10-24 MED ORDER — GLUCOSE BLOOD VI STRP: ORAL_STRIP | Status: DC

## 2014-10-24 NOTE — Telephone Encounter (Signed)
Contacted Diana Clayton concerning test strips. States she was unable to pick up test strips at her pharmacy. No episodes of hypoglycemia noted. Feels like she is breathing much better since office visit. Has not scheduled her appointment for PFT, but states she will have her daughter call and schedule it.  Contacted [facility] pharmacy and they stated instructions and diagnosis needed to be put on prescription to be filled. Prescription resent to pharmacy as requested.   Will discuss with pharmacy option of changing Novolin 70/30 to something easier for her to take, but Diana Clayton states she was told in past that there is nothing else she can take.

## 2014-10-25 ENCOUNTER — Other Ambulatory Visit: Payer: Self-pay | Admitting: Family Medicine

## 2014-10-25 ENCOUNTER — Telehealth: Payer: Self-pay | Admitting: Family Medicine

## 2014-10-25 MED ORDER — GLUCOSE BLOOD VI STRP: ORAL_STRIP | Status: DC

## 2014-10-25 NOTE — Telephone Encounter (Signed)
Vernona RiegerLaura with [facility] called and wanted to report on the patient. The patient has increased wheezing which is relieved by a nebulizer. She has a productive cough. The edema in her legs in less and on some days her BP has been a little at 140/70 and 145/87. If you have any questions please call Vernona RiegerLaura at [phone]. [name]

## 2014-10-30 ENCOUNTER — Telehealth: Payer: Self-pay | Admitting: *Deleted

## 2014-10-30 DIAGNOSIS — E119 Type 2 diabetes mellitus without complications: Secondary | ICD-10-CM

## 2014-10-30 MED ORDER — GLUCOSE BLOOD VI STRP: ORAL_STRIP | Status: DC

## 2014-10-30 NOTE — Telephone Encounter (Signed)
Returned phone call to Mrs. Diana Clayton. Reports wheezing and edema in legs and would like to be seen to restart Lasix. Lasix was held at last visit due to history of worsening renal function and hypovolemia on exam. Denies shortness of breath. Stressed importance of scheduled an appointment this week so she can be seen, labs can be drawn to evaluate kidney function, and lasix can be restarted if indicated. Also discussed that appointment with pharmacy should be made for PFTs and to discuss diabetes management. Requested that I contact daughter to discuss appointments. Attempted to contact daughter x2 and received no response.

## 2014-10-30 NOTE — Assessment & Plan Note (Signed)
I am dealing only with the refill of the test strips, not the clinical complaints.

## 2014-10-30 NOTE — Telephone Encounter (Signed)
Spoke with pt to inform her that the Rx for test strips were faxed to [facility].  Pt also asked to restarted on her "fluid pill".  She stated that is why she is wheezing.  Please give pt a call. Clovis PuMartin, Tamika L, RN

## 2014-10-30 NOTE — Telephone Encounter (Signed)
Mervyn GayLora, RN with [facility] called stating pt has been unable to get test strips due to diagnosis code and pt has medicare.  Mervyn GayLora also stated pt is complaining of increase wheezing and SOB more than usual.  Pt stated her inhaler and nebulizer is not helping.  Precept with Dr. [name]; agree that pt should be [facility].  Pt is trying to find a ride before she make an appt.  Will forward to Dr. [name] to help with the test strips.  Clovis PuMartin, Tamika L, RN

## 2014-11-01 ENCOUNTER — Emergency Department (HOSPITAL_COMMUNITY): Payer: Medicare Other

## 2014-11-01 ENCOUNTER — Emergency Department (HOSPITAL_COMMUNITY)
Admission: EM | Admit: 2014-11-01 | Discharge: 2014-11-01 | Disposition: A | Discharge status: Home / Self Care | Payer: Medicare Other | Attending: Emergency Medicine | Admitting: Emergency Medicine

## 2014-11-01 ENCOUNTER — Encounter (HOSPITAL_COMMUNITY): Payer: Self-pay | Admitting: Emergency Medicine

## 2014-11-01 DIAGNOSIS — Z794 Long term (current) use of insulin: Secondary | ICD-10-CM | POA: Diagnosis not present

## 2014-11-01 DIAGNOSIS — I10 Essential (primary) hypertension: Secondary | ICD-10-CM | POA: Insufficient documentation

## 2014-11-01 DIAGNOSIS — Z8701 Personal history of pneumonia (recurrent): Secondary | ICD-10-CM | POA: Diagnosis not present

## 2014-11-01 DIAGNOSIS — Z8709 Personal history of other diseases of the respiratory system: Secondary | ICD-10-CM | POA: Insufficient documentation

## 2014-11-01 DIAGNOSIS — Z87891 Personal history of nicotine dependence: Secondary | ICD-10-CM | POA: Diagnosis not present

## 2014-11-01 DIAGNOSIS — M7989 Other specified soft tissue disorders: Secondary | ICD-10-CM | POA: Diagnosis not present

## 2014-11-01 DIAGNOSIS — Z79899 Other long term (current) drug therapy: Secondary | ICD-10-CM | POA: Diagnosis not present

## 2014-11-01 DIAGNOSIS — R0602 Shortness of breath: Secondary | ICD-10-CM | POA: Diagnosis present

## 2014-11-01 DIAGNOSIS — R110 Nausea: Secondary | ICD-10-CM | POA: Insufficient documentation

## 2014-11-01 DIAGNOSIS — R062 Wheezing: Secondary | ICD-10-CM | POA: Insufficient documentation

## 2014-11-01 DIAGNOSIS — E119 Type 2 diabetes mellitus without complications: Secondary | ICD-10-CM | POA: Insufficient documentation

## 2014-11-01 DIAGNOSIS — R05 Cough: Secondary | ICD-10-CM | POA: Diagnosis not present

## 2014-11-01 DIAGNOSIS — R011 Cardiac murmur, unspecified: Secondary | ICD-10-CM | POA: Insufficient documentation

## 2014-11-01 DIAGNOSIS — R6883 Chills (without fever): Secondary | ICD-10-CM | POA: Insufficient documentation

## 2014-11-01 LAB — CBC WITH DIFFERENTIAL/PLATELET
BASOS PCT: 0 % (ref 0–1)
Basophils Absolute: 0 10*3/uL (ref 0.0–0.1)
EOS PCT: 2 % (ref 0–5)
Eosinophils Absolute: 0.3 10*3/uL (ref 0.0–0.7)
HCT: 34.4 % — ABNORMAL LOW (ref 36.0–46.0)
Hemoglobin: 11.2 g/dL — ABNORMAL LOW (ref 12.0–15.0)
Lymphocytes Relative: 14 % (ref 12–46)
Lymphs Abs: 1.7 10*3/uL (ref 0.7–4.0)
MCH: 30.1 pg (ref 26.0–34.0)
MCHC: 32.6 g/dL (ref 30.0–36.0)
MCV: 92.5 fL (ref 78.0–100.0)
Monocytes Absolute: 0.8 10*3/uL (ref 0.1–1.0)
Monocytes Relative: 7 % (ref 3–12)
Neutro Abs: 8.8 10*3/uL — ABNORMAL HIGH (ref 1.7–7.7)
Neutrophils Relative %: 77 % (ref 43–77)
Platelets: 249 10*3/uL (ref 150–400)
RBC: 3.72 MIL/uL — ABNORMAL LOW (ref 3.87–5.11)
RDW: 14.4 % (ref 11.5–15.5)
WBC: 11.6 10*3/uL — AB (ref 4.0–10.5)

## 2014-11-01 LAB — BASIC METABOLIC PANEL
Anion gap: 5 (ref 5–15)
BUN: 20 mg/dL (ref 6–23)
CHLORIDE: 103 mmol/L (ref 96–112)
CO2: 29 mmol/L (ref 19–32)
Calcium: 8.2 mg/dL — ABNORMAL LOW (ref 8.4–10.5)
Creatinine, Ser: 1.14 mg/dL — ABNORMAL HIGH (ref 0.50–1.10)
GFR calc Af Amer: 49 mL/min — ABNORMAL LOW (ref >90)
GFR calc non Af Amer: 42 mL/min — ABNORMAL LOW (ref >90)
GLUCOSE: 137 mg/dL — AB (ref 70–99)
POTASSIUM: 2.9 mmol/L — AB (ref 3.5–5.1)
Sodium: 137 mmol/L (ref 135–145)

## 2014-11-01 LAB — CBG MONITORING, ED
GLUCOSE-CAPILLARY: 102 mg/dL — AB (ref 70–99)
GLUCOSE-CAPILLARY: 43 mg/dL — AB (ref 70–99)
Glucose-Capillary: 133 mg/dL — ABNORMAL HIGH (ref 70–99)

## 2014-11-01 LAB — BRAIN NATRIURETIC PEPTIDE: B Natriuretic Peptide: 68.9 pg/mL (ref 0.0–100.0)

## 2014-11-01 MED ORDER — DEXTROSE 50 % IV SOLN
INTRAVENOUS | Status: AC
  Administered 2014-11-01: 50 mL via INTRAVENOUS
  Filled 2014-11-01: qty 50

## 2014-11-01 MED ORDER — IPRATROPIUM BROMIDE 0.02 % IN SOLN
1.0000 mg | Freq: Once | RESPIRATORY_TRACT | Status: AC
  Administered 2014-11-01: 1 mg via RESPIRATORY_TRACT
  Filled 2014-11-01: qty 5

## 2014-11-01 MED ORDER — ALBUTEROL (5 MG/ML) CONTINUOUS INHALATION SOLN
10.0000 mg/h | INHALATION_SOLUTION | Freq: Once | RESPIRATORY_TRACT | Status: AC
  Administered 2014-11-01: 10 mg/h via RESPIRATORY_TRACT
  Filled 2014-11-01: qty 20

## 2014-11-01 MED ORDER — POTASSIUM CHLORIDE CRYS ER 20 MEQ PO TBCR
40.0000 meq | EXTENDED_RELEASE_TABLET | Freq: Once | ORAL | Status: AC
  Administered 2014-11-01: 40 meq via ORAL
  Filled 2014-11-01: qty 2

## 2014-11-01 MED ORDER — INSULIN NPH ISOPHANE & REGULAR (70-30) 100 UNIT/ML ~~LOC~~ SUSP: 15.0000 [IU] | Freq: Two times a day (BID) | SUBCUTANEOUS | Status: DC

## 2014-11-01 MED ORDER — DEXTROSE 50 % IV SOLN
1.0000 | Freq: Once | INTRAVENOUS | Status: AC
  Administered 2014-11-01: 50 mL via INTRAVENOUS

## 2014-11-01 MED ORDER — PREDNISONE 20 MG PO TABS
60.0000 mg | ORAL_TABLET | Freq: Once | ORAL | Status: AC
  Administered 2014-11-01: 60 mg via ORAL
  Filled 2014-11-01: qty 3

## 2014-11-01 NOTE — ED Provider Notes (Signed)
CSN: [id number]     Arrival date & time 11/01/14  1344 History   First MD Initiated Contact with Patient 11/01/14 1352     Chief Complaint  Patient presents with  . Shortness of Breath     (Consider location/radiation/quality/duration/timing/severity/associated sxs/prior Treatment) HPI Comments: Pt is a 81 y.o. female presenting with SOB and cough x 1 week, and hypoglycemia this morning. PMH significant for DM, HTN, diastolic dysfunction, former tobacco smoker. Pt recently hospitalized at the beginning of the year for SOB with acute bronchitis. She says she never really got back to 100% after leaving the hospital and continued to have some wheezing, but over the past week it has gotten worse and also having a productive cough. She denies fevers but has had chills. This morning she tried to use her albuterol treatment, and the home health nurse measured her blood sugar to be in the 40s (got up to 70s before EMS arrived). Pt says she took her 34 units 70/30 this morning and ate a normal breakfast. She also complains of lower extremity swelling, recently taken off of her lasix (supposed to be at hospital discharge, but wasn't done until 2-3 weeks ago at PCP visit) but did take one today because she felt more swollen. She denies any chest pains, dizziness, felt nauseas earlier today but not having any vomiting, no diarrhea, last BM was 2 days ago.   Patient is a 81 y.o. female presenting with shortness of breath. The history is provided by the patient. No language interpreter was used.  Shortness of Breath Severity:  Moderate Onset quality:  Gradual Duration:  1 week Timing:  Constant Progression:  Worsening Chronicity:  Recurrent Context: activity   Ineffective treatments:  Diuretics and inhaler Associated symptoms: cough and wheezing   Associated symptoms: no abdominal pain, no chest pain, no fever, no rash and no vomiting     Past Medical History  Diagnosis Date  . Diabetes mellitus  without complication   . Hypertension   . Pneumonia 2015   Past Surgical History  Procedure Laterality Date  . Left hand surgery    . Thyroid surgery     Family History  Problem Relation Age of Onset  . Diabetes Mother   . Diabetes Brother   . Diabetes Brother    History  Substance Use Topics  . Smoking status: Former Smoker    Quit date: 10/01/1964  . Smokeless tobacco: Never Used  . Alcohol Use: No   OB History    No data available     Review of Systems  Constitutional: Positive for chills. Negative for fever.  Respiratory: Positive for cough, shortness of breath and wheezing.   Cardiovascular: Positive for leg swelling. Negative for chest pain.  Gastrointestinal: Positive for nausea. Negative for vomiting, abdominal pain and diarrhea.  Genitourinary: Negative for dysuria and urgency.  Skin: Negative for rash.  Neurological: Negative for dizziness and light-headedness.  All other systems reviewed and are negative.     Allergies  Influenza vaccines  Home Medications   Prior to Admission medications   Medication Sig Start Date End Date Taking? Authorizing Provider  albuterol (PROVENTIL HFA;VENTOLIN HFA) 108 (90 BASE) MCG/ACT inhaler Inhale 2 puffs into the lungs every 6 (six) hours as needed for wheezing or shortness of breath. 10/19/14  Yes Nicholson N Rumley, DO  albuterol (PROVENTIL) (2.5 MG/3ML) 0.083% nebulizer solution Take 3 mLs (2.5 mg total) by nebulization every 6 (six) hours as needed for wheezing or shortness of breath. 09/19/14  Yes Raeford Razor, MD  atenolol (TENORMIN) 50 MG tablet Take 1 tablet (50 mg total) by mouth daily with breakfast. 10/19/14  Yes [place] N Rumley, DO  Carboxymethylcellul-Glycerin (CLEAR EYES FOR DRY EYES) 1-0.25 % SOLN Place 1 drop into both eyes daily. For allergies and dry eyes   Yes Historical Provider, MD  furosemide (LASIX) 80 MG tablet Take 80 mg by mouth daily.   Yes Historical Provider, MD  glucose blood (FREESTYLE TEST STRIPS)  test strip Use three times a day and as needed for suspected hypoglycemia (low blood sugars) 10/30/14  Yes Sanjuana Letters, MD  guaiFENesin (MUCINEX) 600 MG 12 hr tablet Take 2 tablets (1,200 mg total) by mouth 2 (two) times daily as needed. 10/04/14  Yes Alison Murray, MD  Inulin 1.5 G CHEW Chew 1 capsule by mouth daily. *Fiber Chew*   Yes Historical Provider, MD  Iron-Vitamins (GERITOL COMPLETE) TABS Take 1 tablet by mouth daily.   Yes Historical Provider, MD  levofloxacin (LEVAQUIN) 750 MG tablet Take 1 tablet (750 mg total) by mouth daily. 10/04/14  Yes Alison Murray, MD  aspirin EC 81 MG tablet Take 1 tablet (81 mg total) by mouth daily with breakfast. 10/19/14   Araceli Bouche, DO  HYDROcodone-acetaminophen (NORCO/VICODIN) 5-325 MG per tablet Take 1-2 tablets by mouth every 4 (four) hours as needed for moderate pain. 10/04/14   Alison Murray, MD  insulin NPH-regular Human (NOVOLIN 70/30) (70-30) 100 UNIT/ML injection Inject 15-20 Units into the skin 2 (two) times daily with a meal. Takes 20 units in the morning and 15 units at night 11/01/14   Nani Ravens, MD  Omega-3 Fatty Acids (FISH OIL) 600 MG CAPS Take 600 mg by mouth daily with breakfast.    Historical Provider, MD   BP 144/73 mmHg  Pulse 93  Temp(Src) 98 F (36.7 C) (Oral)  Resp 25  SpO2 99% Physical Exam  Constitutional: She is oriented to person, place, and time. She appears well-developed and well-nourished. No distress.  HENT:  Head: Normocephalic and atraumatic.  Eyes: EOM are normal. Pupils are equal, round, and reactive to light.  Cardiovascular: Normal rate, regular rhythm and intact distal pulses.  Exam reveals no gallop and no friction rub.   Murmur heard. Pulmonary/Chest: Effort normal. No respiratory distress. She has wheezes (throughout all lung fields). She has no rales.  Abdominal: Soft. Bowel sounds are normal. She exhibits no distension. There is no tenderness. There is no rebound.  Musculoskeletal: She  exhibits edema (2+ pitting edema bilaterally up to mid shin).  Neurological: She is alert and oriented to person, place, and time.  Skin: Skin is warm and dry.  Nursing note and vitals reviewed.   ED Course  Procedures (including critical care time) Labs Review Labs Reviewed  BASIC METABOLIC PANEL - Abnormal; Notable for the following:    Potassium 2.9 (*)    Glucose, Bld 137 (*)    Creatinine, Ser 1.14 (*)    Calcium 8.2 (*)    GFR calc non Af Amer 42 (*)    GFR calc Af Amer 49 (*)    All other components within normal limits  CBC WITH DIFFERENTIAL/PLATELET - Abnormal; Notable for the following:    WBC 11.6 (*)    RBC 3.72 (*)    Hemoglobin 11.2 (*)    HCT 34.4 (*)    Neutro Abs 8.8 (*)    All other components within normal limits  CBG MONITORING, ED - Abnormal; Notable  for the following:    Glucose-Capillary 43 (*)    All other components within normal limits  CBG MONITORING, ED - Abnormal; Notable for the following:    Glucose-Capillary 102 (*)    All other components within normal limits  CBG MONITORING, ED - Abnormal; Notable for the following:    Glucose-Capillary 133 (*)    All other components within normal limits  BRAIN NATRIURETIC PEPTIDE    Imaging Review Dg Chest 2 View  11/01/2014   CLINICAL DATA:  Per EMS pt's home health RN checked on her to re-evaluate patient because she was discharged on January 21 from the hospital for bronchitis, noted patient to be diaphoretic, SOB, hypoglycemic with BG 42, lethargic. When EMS arrived, patient's sugar had risen to 73 and she had returned to normal mental status.  EXAM: CHEST  2 VIEW  COMPARISON:  10/01/2014  FINDINGS: Cardiac silhouette borderline enlarged. Aorta is mildly uncoiled. No mediastinal or hilar masses. Clear lungs. No pleural effusion or pneumothorax.  Bony thorax is intact.  IMPRESSION: No active cardiopulmonary disease.   Electronically Signed   By: Amie Portland M.D.   On: 11/01/2014 15:05     EKG  Interpretation None      MDM   Final diagnoses:  Wheezing   Significant wheezing on exam, received breathing treatment via EMS. Breathing comfortably on room air currently but still with wheezing. Also has some fluid overload, no diagnosis on chart of CHF but reviewed echo in Jan 2015 and noted to have grade 2 diastolic dysfunction and recently taken off of lasix. EKG reviewed, LAFB For hypoglycemia given D50 amp in ED For SOB: Continuous duonebs and prednisone . Check Bmet, CBC, BNP, CXR  CBG improved, SOB improved with duonebs (was actually sating fine on room air before continuous duonebs started). BNP in 60s, CXR largely clear, Bmet significant for K of 2.9. Will finish duonebs, ambulate and if doing okay would be stable for discharge with continued scheduled albuterol at home. Given kdur in ED.   Ambulate with pulse ox maintained saturation 90s-100. Wheezing was slightly worsened on walking and slightly tachypneic, but returned to baseline on resting. Offered admission to patient (and daughter at bedside), but pt decliens and feels she is close to her recent baseline. She has follow up scheduled on Monday already. Recommended decreasing insulin to 20 units in the morning and 15 units at night due to recent hypoglycemic episodes the past week.    Nani Ravens, MD 11/01/14 1718  Doug Sou, MD 11/03/14 [id number]

## 2014-11-01 NOTE — Discharge Instructions (Signed)
Wheezing / Shortness of breath: Continue taking albuterol breathing treatments, for the next day do this scheduled every 4 hours.  Use the nebulizer if you can't use the inhaler. Call the clinic today or tomorrow and see if they can get you in to the Pharmacy Clinic to get Lung function testing done.   Low blood sugar: DECREASE your daily insulin: take 20 units in the morning and 15 units at night. Be sure to record your blood sugars in a daily log to show to your doctor at your follow up visit, and if needed they will adjust your insulin then.    If you do think you get worse and need to go to the hospital, and you aren't having a severe emergency, [facility] where the family medicine residents work.

## 2014-11-01 NOTE — ED Provider Notes (Signed)
Presents with wheezing and shortness of breath, nonproductive cough onset approximately one week ago. Accompanied by cough.. Patient was noted to be mildly hypoglycemic upon arrival. On my exam she is alert appropriate lungs with expiratory wheezes and prolonged expiratory phase  Doug SouSam Wael Maestas, MD 11/01/14 1653

## 2014-11-01 NOTE — ED Notes (Signed)
Bed: [id number] Expected date:  Expected time:  Means of arrival:  Comments: EMS- 81yo, F, SOB

## 2014-11-01 NOTE — ED Notes (Signed)
Per EMS pt's home health RN checked on her to re-evaluate patient because she was discharged on January 21 from the hospital for bronchitis, noted patient to be diaphoretic, SOB, hypoglycemic with BG 42, lethargic. When EMS arrived, patient's sugar had risen to 73 and she had returned to normal mental status. EMS administered 10 mg albuterol and 0.5 mg atrovent via nebulization with some improvement. Pt states she self-medicated with prescribed 5 mg albuterol, 0.5 mg atrovent, and 34 units of novolin this morning.

## 2014-11-02 NOTE — Telephone Encounter (Addendum)
Contacted Mrs. Echeverry concerning visit to ED on 2/19. States her breathing has improved significantly since yesterday and while she is still wheezing she is not short of breath. States her blood sugar was 79 at 2:00pm after eating lunch. Dose of 70/30 insulin was decreased to 20units in the morning and 15units at night. Will decrease morning dose to 15units as well to prevent further episodes of hypoglycemia. Discussed importance of following up with pharmacist and it was requested to attempt contacting daughter again to schedule appointment.  Contacted daughter. States her mother is much better since yesterday. Discussed lowering dose of insulin. Asked about switching to insulin pens and discussed that this could be discussed at pharmacy visit. Scheduled apppointment with pharmacy clinic on 2/22 at 9:45am for PFTs and to discuss insulin. Scheduled to follow up with me later that day at 3pm. Will consider starting long acting inhaler at this visit.

## 2014-11-05 ENCOUNTER — Ambulatory Visit (INDEPENDENT_AMBULATORY_CARE_PROVIDER_SITE_OTHER): Payer: Medicare Other | Admitting: Family Medicine

## 2014-11-05 ENCOUNTER — Ambulatory Visit: Payer: Medicare Other | Admitting: Pharmacist

## 2014-11-05 VITALS — BP 147/82 | HR 84 | Temp 98.0°F | Ht 70.0 in | Wt 245.5 lb

## 2014-11-05 DIAGNOSIS — J45909 Unspecified asthma, uncomplicated: Secondary | ICD-10-CM | POA: Insufficient documentation

## 2014-11-05 DIAGNOSIS — J4540 Moderate persistent asthma, uncomplicated: Secondary | ICD-10-CM | POA: Diagnosis not present

## 2014-11-05 DIAGNOSIS — E119 Type 2 diabetes mellitus without complications: Secondary | ICD-10-CM

## 2014-11-05 MED ORDER — METFORMIN HCL 500 MG PO TABS: 500.0000 mg | ORAL_TABLET | Freq: Two times a day (BID) | ORAL | Status: DC

## 2014-11-05 MED ORDER — FLUTICASONE-SALMETEROL 100-50 MCG/DOSE IN AEPB: 1.0000 | INHALATION_SPRAY | Freq: Two times a day (BID) | RESPIRATORY_TRACT | Status: DC

## 2014-11-05 MED ORDER — ALBUTEROL SULFATE HFA 108 (90 BASE) MCG/ACT IN AERS: 2.0000 | INHALATION_SPRAY | Freq: Four times a day (QID) | RESPIRATORY_TRACT | Status: DC | PRN

## 2014-11-05 MED ORDER — ALBUTEROL SULFATE (2.5 MG/3ML) 0.083% IN NEBU: 2.5000 mg | INHALATION_SOLUTION | Freq: Four times a day (QID) | RESPIRATORY_TRACT | Status: DC | PRN

## 2014-11-05 NOTE — Assessment & Plan Note (Signed)
-   History of chronic asthma but suspect component of COPD - Follow up with pharmacy clinic for PFTs - Initiating Advair 100/50 BID. Used clinic model to demonstrate how to use. Had patient repeat steps back to me. Had daughter and granddaughter repeat steps as well since they will most likely be with her.  - Continue Albuterol for rescue - Call 911 if shortness of breath develops - Follow up in 2 weeks to re-evaluate. Will call in a few days to discuss effectiveness

## 2014-11-05 NOTE — Patient Instructions (Signed)
Thank you so much for coming to visit me today!  We are going to try a new inhaler for your wheezing. It is called Advair and you will use it every 12 hours (one puff two times a day). Please continue to use your albuterol, but only when you are short of breath or wheezing. Please schedule an appointment with the Pharmacy Clinic to have your lungs tested (Pulmonary Function Test).  We are going to stop your insulin. We will start Metformin 531m once a day. I will call you in a few days to see how your sugars are doing and we can increase your dose of Metformin if needed. Please keep a record of your blood sugars and bring it to your next visit.  Please return in 2 weeks so we can see how you are doing on the Metformin and Advair!  Thanks again! Dr. RGerlean Ren Metformin tablets What is this medicine? METFORMIN (met FOR min) is used to treat type 2 diabetes. It helps to control blood sugar. Treatment is combined with diet and exercise. This medicine can be used alone or with other medicines for diabetes. This medicine may be used for other purposes; ask your health care provider or pharmacist if you have questions. COMMON BRAND NAME(S): Glucophage What should I tell my health care provider before I take this medicine? They need to know if you have any of these conditions: -anemia -frequently drink alcohol-containing beverages -become easily dehydrated -heart attack -heart failure that is treated with medications -kidney disease -liver disease -polycystic ovary syndrome -serious infection or injury -vomiting -an unusual or allergic reaction to metformin, other medicines, foods, dyes, or preservatives -pregnant or trying to get pregnant -breast-feeding How should I use this medicine? Take this medicine by mouth. Take it with meals. Swallow the tablets with a drink of water. Follow the directions on the prescription label. Take your medicine at regular intervals. Do not take your medicine  more often than directed. Talk to your pediatrician regarding the use of this medicine in children. While this drug may be prescribed for children as young as 141years of age for selected conditions, precautions do apply. Overdosage: If you think you have taken too much of this medicine contact a poison control center or emergency room at once. NOTE: This medicine is only for you. Do not share this medicine with others. What if I miss a dose? If you miss a dose, take it as soon as you can. If it is almost time for your next dose, take only that dose. Do not take double or extra doses. What may interact with this medicine? Do not take this medicine with any of the following medications: -dofetilide -gatifloxacin -certain contrast medicines given before X-rays, CT scans, MRI, or other procedures This medicine may also interact with the following medications: -digoxin -diuretics -female hormones, like estrogens or progestins and birth control pills -isoniazid -medicines for blood pressure, heart disease, irregular heart beat -morphine -nicotinic acid -phenothiazines like chlorpromazine, mesoridazine, prochlorperazine, thioridazine -phenytoin -procainamide -quinidine -quinine -ranitidine -steroid medicines like prednisone or cortisone -stimulant medicines for attention disorders, weight loss, or to stay awake -thyroid medicines -trimethoprim -vancomycin This list may not describe all possible interactions. Give your health care provider a list of all the medicines, herbs, non-prescription drugs, or dietary supplements you use. Also tell them if you smoke, drink alcohol, or use illegal drugs. Some items may interact with your medicine. What should I watch for while using this medicine? Visit your doctor or  health care professional for regular checks on your progress. A test called the HbA1C (A1C) will be monitored. This is a simple blood test. It measures your blood sugar control over the  last 2 to 3 months. You will receive this test every 3 to 6 months. Learn how to check your blood sugar. Learn the symptoms of low and high blood sugar and how to manage them. Always carry a quick-source of sugar with you in case you have symptoms of low blood sugar. Examples include hard sugar candy or glucose tablets. Make sure others know that you can choke if you eat or drink when you develop serious symptoms of low blood sugar, such as seizures or unconsciousness. They must get medical help at once. Tell your doctor or health care professional if you have high blood sugar. You might need to change the dose of your medicine. If you are sick or exercising more than usual, you might need to change the dose of your medicine. Do not skip meals. Ask your doctor or health care professional if you should avoid alcohol. Many nonprescription cough and cold products contain sugar or alcohol. These can affect blood sugar. This medicine may cause ovulation in premenopausal women who do not have regular monthly periods. This may increase your chances of becoming pregnant. You should not take this medicine if you become pregnant or think you may be pregnant. Talk with your doctor or health care professional about your birth control options while taking this medicine. Contact your doctor or health care professional right away if think you are pregnant. If you are going to need surgery, a MRI, CT scan, or other procedure, tell your doctor that you are taking this medicine. You may need to stop taking this medicine before the procedure. Wear a medical ID bracelet or chain, and carry a card that describes your disease and details of your medicine and dosage times. What side effects may I notice from receiving this medicine? Side effects that you should report to your doctor or health care professional as soon as possible: -allergic reactions like skin rash, itching or hives, swelling of the face, lips, or  tongue -breathing problems -feeling faint or lightheaded, falls -muscle aches or pains -signs and symptoms of low blood sugar such as feeling anxious, confusion, dizziness, increased hunger, unusually weak or tired, sweating, shakiness, cold, irritable, headache, blurred vision, fast heartbeat, loss of consciousness -slow or irregular heartbeat -unusual stomach pain or discomfort -unusually tired or weak Side effects that usually do not require medical attention (report to your doctor or health care professional if they continue or are bothersome): -diarrhea -headache -heartburn -metallic taste in mouth -nausea -stomach gas, upset This list may not describe all possible side effects. Call your doctor for medical advice about side effects. You may report side effects to FDA at 1-800-FDA-1088. Where should I keep my medicine? Keep out of the reach of children. Store at room temperature between 15 and 30 degrees C (59 and 86 degrees F). Protect from moisture and light. Throw away any unused medicine after the expiration date. NOTE: This sheet is a summary. It may not cover all possible information. If you have questions about this medicine, talk to your doctor, pharmacist, or health care provider.  2015, Elsevier/Gold Standard. (2012-12-13 16:03:44)  Fluticasone; Salmeterol inhalation aerosol (Advair) What is this medicine? FLUTICASONE; SALMETEROL (floo TIK a sone; sal ME te role) inhalation is a combination of two medicines that decrease inflammation and help to open up the  airways of your lungs. It is used to treat asthma. Do NOT use for an acute asthma attack. This medicine may be used for other purposes; ask your health care provider or pharmacist if you have questions. COMMON BRAND NAME(S): Advair HFA What should I tell my health care provider before I take this medicine? They need to know if you have any of these conditions: -bone problems -immune system problems -diabetes -heart  disease or irregular heartbeat -high blood pressure -infection -pheochromocytoma -seizures -thyroid disease -worsening asthma -an unusual or allergic reaction to fluticasone; salmeterol, other corticosteroids, other medicines, foods, dyes, or preservatives -pregnant or trying to get pregnant -breast-feeding How should I use this medicine? This medicine is inhaled through the mouth. Follow the directions on the prescription label. Shake well for 5 seconds before each use. After using the inhaler, rinse your mouth with water. Make sure not to swallow the water. Take your medicine at regular intervals. Do not take your medicine more often than directed. Do not stop taking except on your doctor's advice. Make sure that you are using your inhaler correctly. Ask you doctor or health care provider if you have any questions. A special MedGuide will be given to you by the pharmacist with each prescription and refill. Be sure to read this information carefully each time. Talk to your pediatrician regarding the use of this medicine in children. While this drug may be prescribed for children as young as 52 years of age for selected conditions, precautions do apply. Overdosage: If you think you have taken too much of this medicine contact a poison control center or emergency room at once. NOTE: This medicine is only for you. Do not share this medicine with others. What if I miss a dose? If you miss a dose, use it as soon as you remember. If it is almost time for your next dose, use only that dose and continue with your regular schedule, spacing doses evenly. Do not use double or extra doses. What may interact with this medicine? Do not take this medicine with any of the following medications: -MAOIs like Carbex, Eldepryl, Marplan, Nardil, and Parnate This medicine may also interact with the following medications: -aminophylline or theophylline -antiviral medicines for HIV or AIDS -diuretics -medicines  for colds -medicines for depression or emotional conditions -medicines for fungal infections like ketoconazole and itraconazole -medicines for the heart like metoprolol, propanolol -medicines for weight loss including some herbal products -other medicine for breathing problems -pimozide -some antibiotics like clarithromycin, erythromycin, levofloxacin, linezolid, and telithromycin -vaccines This list may not describe all possible interactions. Give your health care provider a list of all the medicines, herbs, non-prescription drugs, or dietary supplements you use. Also tell them if you smoke, drink alcohol, or use illegal drugs. Some items may interact with your medicine. What should I watch for while using this medicine? Visit your doctor for regular check ups. Tell your doctor or health care professional if your symptoms do not get better. If your symptoms get worse or if you need your short-acting inhalers more often, call your doctor right away. Do not use this medicine more than every 12 hours. If you have asthma, be aware that using this medicine may increase your risk of dying from asthma-related problems. Talk to your doctor about the risks and benefits of taking this medicine. NEVER use this medicine for an acute asthma attack. This medicine may increase your risk of getting an infection. Tell your doctor or health care professional if you are  around anyone with measles or chickenpox, or if you develop sores or blisters that do not heal properly. What side effects may I notice from receiving this medicine? Side effects that you should report to your doctor or health care professional as soon as possible: -allergic reactions like skin rash or hives, swelling of the face, lips, or tongue -changes in vision -chest pain -feeling faint or lightheaded, falls -fever or chills -irregular heartbeat Side effects that usually do not require medical attention (report to your doctor or health care  professional if they continue or are bothersome): -coughing, hoarseness, throat irritation -headache -nervousness -stomach problems -stuffy nose -tremors This list may not describe all possible side effects. Call your doctor for medical advice about side effects. You may report side effects to FDA at 1-800-FDA-1088. Where should I keep my medicine? Keep out of the reach of children. Store at room temperature between 15 and 30 degrees C (59 and 86 degrees F). Store inhaler with the mouthpiece down. Keep track of the number of doses used. Throw away the inhaler after 120 inhalations or after the expiration date, whichever comes first. NOTE: This sheet is a summary. It may not cover all possible information. If you have questions about this medicine, talk to your doctor, pharmacist, or health care provider.  2015, Elsevier/Gold Standard. (2013-01-04 08:09:33)

## 2014-11-05 NOTE — Progress Notes (Signed)
Subjective:     Patient ID: Diana RosenthalAugusta Clayton, female   DOB: 08-29-1927, 81 y.o.   MRN: [id number]  HPI 81yo female presenting for follow up of Asthma/COPD and Diabetes. # Asthma/COPD - Did not go to Pharmacy Clinic for PFTs. States she called to cancel and plans to reschedule - Continues to note mild wheezing however it is better than at previous visit. Occasionally has flares of shortness of breath, however relieved by Nebulizer - Needs refill of Albuterol - Has never tried any long acting medications  # Diabetes - Dose of 70/30 recently decreased to 15 units BID - States blood sugar was low this morning at 68. Felt poorly with low blood sugar, but felt better after eating.  - Daughter states she has been helping her eat healthier, which may be contributing to low blood sugars - Has never tried Metformin or any other type of insulin. States her sugar was so high when she was diagnosed that her current insulin was initiated. - Last A1C 7.1  Review of Systems  Respiratory: Positive for wheezing. Negative for shortness of breath.        Objective:   Physical Exam  Constitutional: She is oriented to person, place, and time. She appears well-developed and well-nourished. No distress.  HENT:  Head: Normocephalic and atraumatic.  Cardiovascular: Normal rate and regular rhythm.  Exam reveals no gallop and no friction rub.   No murmur heard. Pulmonary/Chest: Effort normal. No respiratory distress.  Mild expiratory wheeze much improved since last office visit  Musculoskeletal: She exhibits edema.  Neurological: She is alert and oriented to person, place, and time.  Skin: Skin is warm.  Psychiatric: She has a normal mood and affect. Her behavior is normal.      Assessment:     Please refer to Problem List for Assessment.    Plan:     Please refer to Problem List for Plan.

## 2014-11-05 NOTE — Assessment & Plan Note (Signed)
-   Will discontinue insulin today due to continued episodes of hypoglycemia - Initiating Metformin 500mg . Will titrate up as indicated. Discussed potential adverse effects - Will contact in a few days to discuss effectiveness - Return in 2 weeks. Bring log of CBGs to visit.

## 2014-11-06 NOTE — Telephone Encounter (Signed)
Verbal ok has been given to Diana Clayton

## 2014-11-06 NOTE — Telephone Encounter (Signed)
LV for Mallory to call back

## 2014-11-06 NOTE — Telephone Encounter (Signed)
Needs verbal orders for OT. Twice a week for 4 weeks Please call ASAP. She is seeing the patient this afternoon

## 2014-11-07 ENCOUNTER — Telehealth: Payer: Self-pay | Admitting: Family Medicine

## 2014-11-07 NOTE — Telephone Encounter (Signed)
Patient reporting elevation of glucose levels since reduction of medications.  Please call her to discuss.

## 2014-11-07 NOTE — Telephone Encounter (Signed)
Pt states that her glucose was 211 about 4pm and 222 this morning.  Pt is taking last dose as directed.  Would like to know if there is anything she needs to do. Diana Clayton,CMA

## 2014-11-09 NOTE — Telephone Encounter (Signed)
Called Diana Clayton concerning blood sugars. States blood sugar has been running from 170s to 200s. No episodes of hypoglycemia. Had diarrhea x1 day which resolved. Will titrate dose of Metformin to BID.   Breathing is much improved. Has been able to use inhaler. Wheezing has subsided.

## 2014-11-13 ENCOUNTER — Telehealth: Payer: Self-pay | Admitting: Family Medicine

## 2014-11-13 ENCOUNTER — Telehealth: Payer: Self-pay | Admitting: *Deleted

## 2014-11-13 DIAGNOSIS — E119 Type 2 diabetes mellitus without complications: Secondary | ICD-10-CM

## 2014-11-13 NOTE — Telephone Encounter (Signed)
Vernona RiegerLaura the patients home health nurse called because the patients blood sugar is still running high. Her reading are always 220-236. The pharmacy is also faxing us a refill request for her strips. Please follow the instructions for refill since this has to be done a certain way. Please call Vernona RiegerLaura if you have any questions [phone]. jw

## 2014-11-13 NOTE — Telephone Encounter (Signed)
Pt need a refill on Freestyle Precision Neo test strips.  Please include ICD-10 code, provider signature and specific directions to bill medicare.  Clovis PuMartin, Timber Lucarelli L, RN

## 2014-11-14 MED ORDER — GLUCOSE BLOOD VI STRP: ORAL_STRIP | Status: DC

## 2014-11-14 NOTE — Telephone Encounter (Signed)
I have completed this 

## 2014-11-14 NOTE — Telephone Encounter (Signed)
Pt is calling again and doesn't understand why it is taking the doctor so long to call in her strips.She wants another doctor to do this,. She is out of strips and her blood sugar is very high. Can we get another doctor because she very stressed over this situation. jw

## 2014-11-14 NOTE — Telephone Encounter (Signed)
Huntley DecSara read the notes below mind from [place] . Thank you Annice Pihjackie

## 2014-11-15 ENCOUNTER — Telehealth: Payer: Self-pay | Admitting: *Deleted

## 2014-11-15 NOTE — Telephone Encounter (Signed)
Calling back, sugars are still running above 200, has not heard back since Tuesday.

## 2014-11-15 NOTE — Telephone Encounter (Signed)
-----   Message from [place], DO sent at 11/15/2014  2:42 PM EST ----- I've already had several conversations with Mrs. Zuluaga that blood sugars in the low 200s are fine and a lot better than her dropping to 40s and 50s like before. She needs to schedule an appointment with Pete for LFTs and to discuss her diabetes as we discussed previously. I also asked for her to follow up in clinic in two weeks on 11/05/14 to adjust her medications, so she can schedule an appointment to be seen either this week or next week. It looks like Dr. Walden has already sent in the scripts faxed over. Is there anything else I need to do?  [place] 

## 2014-11-15 NOTE — Telephone Encounter (Signed)
Left voice message for Diana RiegerLaura stating that a fax Rx was signed by Dr. Gwendolyn GrantWalden and faxed backed to [facility] this morning.  An electronic Rx was also sent by Dr. Gwendolyn GrantWalden yesterday 11/14/2014.  Clovis PuMartin, Emidio Warrell L, RN

## 2014-11-15 NOTE — Telephone Encounter (Signed)
-----   Message from [facility], [place] sent at 11/15/2014  2:42 PM EST ----- I've already had several conversations with Mrs. Radigan that blood sugars in the low 200s are fine and a lot better than her dropping to 40s and 50s like before. [facility] and to discuss her diabetes as we discussed previously. I also asked for her to follow up in clinic in two weeks on 11/05/14 to adjust her medications, so she can schedule an appointment to be seen either this week or next week. It looks like Dr. Gwendolyn GrantWalden has already sent in the scripts faxed over. Is there anything else I need to do?  [facility]

## 2014-11-15 NOTE — Telephone Encounter (Signed)
Spoke with pt regarding test strips and blood sugar.  Informed her that the test strips were sent in electronically and a faxed Rx was sent.  Also that she need an appt with Dr. Raymondo BandKoval and PCP.  Pt requested to call her daughter so she could set the appts up.  Clovis PuMartin, Tamika L, RN

## 2014-11-15 NOTE — Telephone Encounter (Signed)
Left voice message for pt's daughter to call and schedule an appt with Dr. Raymondo BandKoval and a follow up appt with PCP.  Clovis PuMartin, Tamika L, RN

## 2014-11-24 ENCOUNTER — Telehealth: Payer: Self-pay | Admitting: Family Medicine

## 2014-11-24 NOTE — Telephone Encounter (Signed)
[facility] that patient's bp was elevated at 160's.  Received this call because her bp was out of the parameters (>140).  Will continue to monitor for now.   Myra RudeJeremy E Laterrance Nauta, MD PGY-2, [facility] 11/24/2014, 1:09 PM

## 2014-11-24 NOTE — Telephone Encounter (Signed)
Speaking with patient's daughter that her mother's bp and sugar are elevated. Sugar 283 and bp 189/72. Re-check 158/79. Patient is reporting some tightness in the back of her neck. Some pressure in the frontal region of her head but otherwise is at her baseline. Daughter reports that her mother is anxious with her blood sugar being elevated. Will continue to monitor for now. Given red flags to daughter and she understands.   Myra RudeJeremy E Makynzee Tigges, MD PGY-2, [facility] 11/24/2014, 2:05 PM

## 2014-11-28 ENCOUNTER — Telehealth: Payer: Self-pay | Admitting: Family Medicine

## 2014-11-28 NOTE — Telephone Encounter (Signed)
Need verbal order from Dr. [name] to continue home health nursing services 2 times weekly and need recertification next week.  May speak with Pete GlatterLaura Yannella [facility] for order at the ph number [phone].  Ms. Diana Clayton is a patient of Dr.s Rumley, but need Dr. [name] for order and recert

## 2014-11-30 ENCOUNTER — Ambulatory Visit (INDEPENDENT_AMBULATORY_CARE_PROVIDER_SITE_OTHER): Payer: Medicare Other | Admitting: Pharmacist

## 2014-11-30 ENCOUNTER — Encounter: Payer: Self-pay | Admitting: Pharmacist

## 2014-11-30 ENCOUNTER — Encounter: Payer: Self-pay | Admitting: Family Medicine

## 2014-11-30 ENCOUNTER — Ambulatory Visit (INDEPENDENT_AMBULATORY_CARE_PROVIDER_SITE_OTHER): Payer: Medicare Other | Admitting: Family Medicine

## 2014-11-30 VITALS — BP 139/60 | HR 80 | Temp 98.4°F | Ht 70.0 in | Wt 231.0 lb

## 2014-11-30 DIAGNOSIS — J4530 Mild persistent asthma, uncomplicated: Secondary | ICD-10-CM | POA: Diagnosis not present

## 2014-11-30 DIAGNOSIS — E876 Hypokalemia: Secondary | ICD-10-CM | POA: Diagnosis not present

## 2014-11-30 DIAGNOSIS — E079 Disorder of thyroid, unspecified: Secondary | ICD-10-CM | POA: Diagnosis not present

## 2014-11-30 DIAGNOSIS — I10 Essential (primary) hypertension: Secondary | ICD-10-CM

## 2014-11-30 DIAGNOSIS — E119 Type 2 diabetes mellitus without complications: Secondary | ICD-10-CM | POA: Diagnosis present

## 2014-11-30 LAB — BASIC METABOLIC PANEL
BUN: 17 mg/dL (ref 6–23)
CALCIUM: 10 mg/dL (ref 8.4–10.5)
CHLORIDE: 97 meq/L (ref 96–112)
CO2: 25 meq/L (ref 19–32)
CREATININE: 1.06 mg/dL (ref 0.50–1.10)
Glucose, Bld: 228 mg/dL — ABNORMAL HIGH (ref 70–99)
Potassium: 4.3 mEq/L (ref 3.5–5.3)
Sodium: 134 mEq/L — ABNORMAL LOW (ref 135–145)

## 2014-11-30 LAB — POCT GLYCOSYLATED HEMOGLOBIN (HGB A1C): Hemoglobin A1C: 7.8

## 2014-11-30 MED ORDER — FLUTICASONE-SALMETEROL 250-50 MCG/DOSE IN AEPB: 1.0000 | INHALATION_SPRAY | Freq: Two times a day (BID) | RESPIRATORY_TRACT | Status: DC

## 2014-11-30 MED ORDER — GLUCOSE BLOOD VI STRP: ORAL_STRIP | Status: AC

## 2014-11-30 MED ORDER — METFORMIN HCL 500 MG PO TABS: 1000.0000 mg | ORAL_TABLET | Freq: Two times a day (BID) | ORAL | Status: DC

## 2014-11-30 NOTE — Assessment & Plan Note (Signed)
Adjustments per Dr. Raymondo BandKoval.

## 2014-11-30 NOTE — Assessment & Plan Note (Signed)
Recheck and replace if still low.

## 2014-11-30 NOTE — Assessment & Plan Note (Addendum)
Spirometry evaluation reveals near normal lung function with FEV1/FVC ratio at 68%.  Unable to make firm assessment as she has taken Advair this AM. Due to signivicant improvement in breathing with Advair we will continue and increase dose to 250-50 as she continues to have suboptimal lung function. Educated patient on purpose, proper use, potential adverse effects including risk of esophageal candidiasis and need to rinse mouth after each use.  Reviewed results of pulmonary function tests. Also, recommended she start taking her Clear Eyes eye drops again as they seemed to help with her eye irritation in the past. Advised the patient that if the eye drops were not helping enough or if she had any additional allergy symptoms, she could try an OTC allergy medicine like cetirizine or loratidine.   Pt verbalized understanding of results and education.  Written pt instructions provided.

## 2014-11-30 NOTE — Patient Instructions (Signed)
It was nice to meet you today Diana Clayton.  Your lung test was normal. To help with your breathing we are going to increase the Advair dose to 200/5 - one inhalation twice daily.  We are going to increase your metformin to 2 tablets twice daily.  Start taking your Clear Eye drops again. If you need more than that you can take Claritin, Allegra, or Zyrtec and just buy it from the pharmacy.  I send in the test strips to the mail order pharmacy - [facility].

## 2014-11-30 NOTE — Assessment & Plan Note (Signed)
Good control

## 2014-11-30 NOTE — Progress Notes (Signed)
   Subjective:    Patient ID: Diana Clayton, female    DOB: [birth year], 81 y.o.   MRN: [id number]  HPI Call daughter, Diana Clayton, with blood test results at [phone] Seen earlier by Pharmacologist, Dr. Raymondo BandKoval and metformin increase. Ms Diana Clayton is FU pneumonia and weakness.  She is doing much better.  She lives alone but does have strong family support that check on her daily.  [place]. States she did not get results of thyroid biopsy or lab work.  Told biopsy was benign and my only concern on the labwork was low K, which I don't think was ever corrected.   She is receiving home PT which will run out soon.  I hope we can get this extended.  Given that she lives alone, I want her as strong as possible before turning her loose.    Review of Systems     Objective:   Physical Exam  Gen well appearing Lungs clear Cardiac RRR with 1/6 SEM Ext no edema.       Assessment & Plan:

## 2014-11-30 NOTE — Assessment & Plan Note (Addendum)
Benign.  Recent TSH low, recheck now that recovered.

## 2014-11-30 NOTE — Progress Notes (Signed)
S:    Patient arrives with her daughter, Diana Clayton, ambulating slowly with assistance of a cane.  Presents for lung function evaluation and diabetes managment.  Patient reports breathing has been much better since starting the Advair on 11/05/14. The last time she used albuterol nebulizer was weeks ago. She took her Advair this morning at about 7:15am.  Smoked about 1 cigarette per day from the age of 81 to around 6430. Current non-smoker.  Patient also reports allergy issues with eye redness and heaviness being her biggest complaint.   Patient reports having history of Diabetes since "her 6660s". Reports no issues with medication and any GI effects she felt due to Metformin initiation have resolved. Patient reports adherence with medications. Current diabetes medications include Metformin 500mg  BID. Patient denies hypoglycemic events and reports that FBGs have been in the range of 201 to 290.   Patient reported dietary habits: Vegetable soup, not a lot of meat, snacks on peanuts; Daughter is aware of need to limit carbs Patient reported exercise habits: Walks around the house primarily.   Patient reports nocturia (2x/night), but says it has gotten better since reducing her Lasix dose.Patient denies neuropathy and visual changes.  O: CAT score= 17 See Documentation Flowsheet - CAT/COPD for complete symptom scoring.  See "scanned report" or Documentation Flowsheet (discrete results - PFTs) for  Spirometry results. Patient provided good effort while attempting spirometry.   Glucose meter readings are as follows:  Average 7-day: 274 Average 14-day: 265 Average 30-day: 263  Lab Results  Component Value Date   HGBA1C 7.1* 10/02/2014    A/P: Spirometry evaluation reveals near normal lung function with FEV1/FVC ratio at 68%.  Unable to make firm assessment as she has taken Advair this AM. Due to signivicant improvement in breathing with Advair we will continue and increase dose to 250-50 as she  continues to have suboptimal lung function. Educated patient on purpose, proper use, potential adverse effects including risk of esophageal candidiasis and need to rinse mouth after each use.  Reviewed results of pulmonary function tests. Also, recommended she start taking her Clear Eyes eye drops again as they seemed to help with her eye irritation in the past. Advised the patient that if the eye drops were not helping enough or if she had any additional allergy symptoms, she could try an OTC allergy medicine like cetirizine or loratidine.   Pt verbalized understanding of results and education.  Written pt instructions provided.   Diabetes currently uncontrolled after discontinuation of insulin in February. Pt denies hypoglycemic events and is able to verbalize appropriate hypoglycemia management plan. Reports adherence with medication. Control is suboptimal due to discontinuation of insulin after several hypoglycemic events and the need to further optimize Metformin therapy. Patient and her daughter verbalized desire to not add insulin back and instead continue to optimize Metformin therapy due to concern about history of hypoglycemia with insulin. Increased Metformin to 1000mg  BID.  BMET - Next visit.  Next A1C anticipated June 2016.  Written patient instructions provided.  Follow up in Pharmacist Clinic Visit on April 15th at 9am.   Total time in face to face counseling 45 minutes.  Patient seen with Juanita CraverStacey Karl, PharmD, Cathlean CowerKristen Gray, PharmD Candidate, and Paulino RilyPete Aslyn Cottman, PharmD, CPP, BCPS.

## 2014-11-30 NOTE — Progress Notes (Signed)
Patient ID: Diana RosenthalAugusta Clayton, female   DOB: 1927/02/19, 81 y.o.   MRN: [id number] Reviewed: Agree with Dr. Macky LowerKoval's documentation and management.

## 2014-11-30 NOTE — Assessment & Plan Note (Signed)
Diabetes currently uncontrolled after discontinuation of insulin in February. Pt denies hypoglycemic events and is able to verbalize appropriate hypoglycemia management plan. Reports adherence with medication. Control is suboptimal due to discontinuation of insulin after several hypoglycemic events and the need to further optimize Metformin therapy. Patient and her daughter verbalized desire to not add insulin back and instead continue to optimize Metformin therapy due to concern about history of hypoglycemia with insulin. Increased Metformin to 1000mg  BID.  Next A1C anticipated June 2016.

## 2014-11-30 NOTE — Patient Instructions (Signed)
I am delighted that you are feeling better. I hope we can continue the physical therapy longer. I will call your daughter Monday with the blood test results

## 2014-12-01 LAB — TSH: TSH: 0.109 u[IU]/mL — AB (ref 0.350–4.500)

## 2014-12-03 ENCOUNTER — Telehealth: Payer: Self-pay | Admitting: Family Medicine

## 2014-12-03 NOTE — Telephone Encounter (Signed)
Diana RiegerLaura from [facility] called and would like to re-certify the patient for home health 2 times a week for 6 weeks. Please call Diana RiegerLaura with verbal orders at [phone]. [name]

## 2014-12-04 NOTE — Telephone Encounter (Signed)
Spoke with Laura and gave verbal ok 

## 2014-12-10 ENCOUNTER — Telehealth: Payer: Self-pay | Admitting: Family Medicine

## 2014-12-10 NOTE — Telephone Encounter (Signed)
Contacted [facility] to initiate coverage review of Advair Diskus 250-50. Indication given: COPD/Asthma. Approved. Case ID # [id number].  Start effective November 10, 2014. Ends December 09 2017.  Dr. Caroleen Hammanumley

## 2014-12-12 ENCOUNTER — Telehealth: Payer: Self-pay | Admitting: Family Medicine

## 2014-12-12 NOTE — Telephone Encounter (Signed)
Home health nurse called and just wanted to report that the patients weight is staying between 226-231 lbs, and her Blood Sugar are around 188-186 in the mornings and a little over the 200's in the evenings. jw

## 2014-12-18 ENCOUNTER — Telehealth: Payer: Self-pay | Admitting: Family Medicine

## 2014-12-18 NOTE — Telephone Encounter (Signed)
Her weight is down 11 lbs Blood sugars are continuing to run above 200 Please advise

## 2014-12-19 NOTE — Telephone Encounter (Signed)
Spoke with pt and informed her of below and scheduled her an appt for 01/09/2015 @ 4:00pm with Dr. Caroleen Hammanumley. Lamonte SakaiZimmerman Rumple, April D

## 2014-12-19 NOTE — Telephone Encounter (Signed)
Please schedule an office visit so we can check weight and discuss blood sugars. We will consider adding additional diabetes medications at this time. Per daughter diet has significantly changed recently and that she has completely cut out unhealthy foods and sweets, which may be contributing to weight loss.

## 2014-12-26 ENCOUNTER — Telehealth: Payer: Self-pay | Admitting: Family Medicine

## 2014-12-26 NOTE — Telephone Encounter (Signed)
Vernona RiegerLaura nurse from [facility] calls, patient's blood glucose still above 200, ranging between 196-272 the past few days. Any question please call Vernona RiegerLaura at [phone].

## 2014-12-28 ENCOUNTER — Ambulatory Visit: Payer: Medicare Other | Admitting: Pharmacist

## 2015-01-03 ENCOUNTER — Telehealth: Payer: Self-pay | Admitting: Family Medicine

## 2015-01-03 NOTE — Telephone Encounter (Signed)
Reporting Blood sugar running high (236-265 before first meal of the day) and has no appetite

## 2015-01-08 ENCOUNTER — Telehealth: Payer: Self-pay | Admitting: Family Medicine

## 2015-01-08 NOTE — Telephone Encounter (Signed)
Noted. Scheduled for appointment to discuss adding to diabetic regimen 4/27.

## 2015-01-08 NOTE — Telephone Encounter (Signed)
Home health nurse from [facility] called just to alert the doctor of a couple of things. One her blood sugar has been running a little high since she has been off the insulin. Jw

## 2015-01-09 ENCOUNTER — Encounter: Payer: Self-pay | Admitting: Family Medicine

## 2015-01-09 ENCOUNTER — Ambulatory Visit (INDEPENDENT_AMBULATORY_CARE_PROVIDER_SITE_OTHER): Payer: Medicare Other | Admitting: Family Medicine

## 2015-01-09 VITALS — BP 146/70 | HR 77 | Temp 97.7°F | Ht 70.0 in | Wt 219.0 lb

## 2015-01-09 DIAGNOSIS — E119 Type 2 diabetes mellitus without complications: Secondary | ICD-10-CM

## 2015-01-09 MED ORDER — METFORMIN HCL 500 MG PO TABS: 1000.0000 mg | ORAL_TABLET | Freq: Two times a day (BID) | ORAL | Status: DC

## 2015-01-09 MED ORDER — DULAGLUTIDE 0.75 MG/0.5ML ~~LOC~~ SOAJ: 0.7500 mg | SUBCUTANEOUS | Status: DC

## 2015-01-09 MED ORDER — FLUTICASONE-SALMETEROL 250-50 MCG/DOSE IN AEPB: 1.0000 | INHALATION_SPRAY | Freq: Two times a day (BID) | RESPIRATORY_TRACT | Status: DC

## 2015-01-09 NOTE — Patient Instructions (Signed)
Thank you so much for coming to visit me today! Please continue to take the Metformin  twice a day; I will send in a refill to your pharmacy. We will add another medication (Trulicity), which is an injection you can take once a week. Please call the office and let me know if you have trouble affording it and we can try another medication. I will call you in 1-2 weeks to see how the medicine is working.  I would rather you not take the 70-30 insulin since it has caused severe hypoglycemia multiple times.   Thanks again! Dr. Caroleen Hamman  Dulaglutide injection What is this medicine? DULAGLUTIDE (DOO la GLOO tide) is used to improve blood sugar control in adults with type 2 diabetes. This medicine may be used with other oral diabetes medicines. This medicine may be used for other purposes; ask your health care provider or pharmacist if you have questions. COMMON BRAND NAME(S): TRULICITY What should I tell my health care provider before I take this medicine? They need to know if you have any of these conditions: -endocrine tumors (MEN 2) or if someone in your family had these tumors -history of pancreatitis -kidney disease -liver disease -stomach problems -thyroid cancer or if someone in your family had thyroid cancer -an unusual or allergic reaction to dulaglutide, other medicines, foods, dyes, or preservatives -pregnant or trying to get pregnant -breast-feeding How should I use this medicine? This medicine is for injection under the skin of your upper leg (thigh), stomach area, or upper arm. It is usually given once every week (every 7 days). You will be taught how to prepare and give this medicine. Use exactly as directed. Take your medicine at regular intervals. Do not take it more often than directed. If you use this medicine with insulin, you should inject this medicine and the insulin separately. Do not mix them together. Do not give the injections right next to each other. Change (rotate)  injection sites with each injection. It is important that you put your used needles and syringes in a special sharps container. Do not put them in a trash can. If you do not have a sharps container, call your pharmacist or healthcare provider to get one. A special MedGuide will be given to you by the pharmacist with each prescription and refill. Be sure to read this information carefully each time. Talk to your pediatrician regarding the use of this medicine in children. Special care may be needed. Overdosage: If you think you've taken too much of this medicine contact a poison control center or emergency room at once. Overdosage: If you think you have taken too much of this medicine contact a poison control center or emergency room at once. NOTE: This medicine is only for you. Do not share this medicine with others. What if I miss a dose? If you miss a dose, take it as soon as you can within 3 days after the missed dose. Then take your next dose at your regular weekly time. If it has been longer than 3 days after the missed dose, do not take the missed dose. Take the next dose at your regular time. Do not take double or extra doses. If you have questions about a missed dose, contact your health care provider for advice. What may interact with this medicine? Do not take this medicine with any of the following medications: -gatifloxacin Many medications may cause changes in blood sugar, these include: -alcohol containing beverages -aspirin and aspirin-like drugs -chloramphenicol -chromium -  diuretics -female hormones, such as estrogens or progestins, birth control pills -heart medicines -isoniazid -female hormones or anabolic steroids -medications for weight loss -medicines for allergies, asthma, cold, or cough -medicines for mental problems -medicines called MAO inhibitors - Nardil, Parnate, Marplan, Eldepryl -niacin -NSAIDS, such as  ibuprofen -pentamidine -phenytoin -probenecid -quinolone antibiotics such as ciprofloxacin, levofloxacin, ofloxacin -some herbal dietary supplements -steroid medicines such as prednisone or cortisone -thyroid hormonesSome medications can hide the warning symptoms of low blood sugar (hypoglycemia). You may need to monitor your blood sugar more closely if you are taking one of these medications. These include: -beta-blockers, often used for high blood pressure or heart problems (examples include atenolol, metoprolol, propranolol) -clonidine -guanethidine -reserpine This list may not describe all possible interactions. Give your health care provider a list of all the medicines, herbs, non-prescription drugs, or dietary supplements you use. Also tell them if you smoke, drink alcohol, or use illegal drugs. Some items may interact with your medicine. What should I watch for while using this medicine? Visit your doctor or health care professional for regular checks on your progress. A test called the HbA1C (A1C) will be monitored. This is a simple blood test. It measures your blood sugar control over the last 2 to 3 months. You will receive this test every 3 to 6 months. Learn how to check your blood sugar. Learn the symptoms of low and high blood sugar and how to manage them. Always carry a quick-source of sugar with you in case you have symptoms of low blood sugar. Examples include hard sugar candy or glucose tablets. Make sure others know that you can choke if you eat or drink when you develop serious symptoms of low blood sugar, such as seizures or unconsciousness. They must get medical help at once. Tell your doctor or health care professional if you have high blood sugar. You might need to change the dose of your medicine. If you are sick or exercising more than usual, you might need to change the dose of your medicine. Do not skip meals. Ask your doctor or health care professional if you should  avoid alcohol. Many nonprescription cough and cold products contain sugar or alcohol. These can affect blood sugar. Wear a medical ID bracelet or chain, and carry a card that describes your disease and details of your medicine and dosage times. What side effects may I notice from receiving this medicine? Side effects that you should report to your doctor or health care professional as soon as possible: -allergic reactions like skin rash, itching or hives, swelling of the face, lips, or tongue -breathing problems -signs and symptoms of low blood sugar such as feeling anxious, confusion, dizziness, increased hunger, unusually weak or tired, sweating, shakiness, cold, irritable, headache, blurred vision, fast heartbeat, loss of consciousness -unusual stomach upset or pain -vomiting Side effects that usually do not require medical attention (Report these to your doctor or health care professional if they continue or are bothersome.):diarrhea -heartburn -loss of appetite -nausea -pain, redness, or irritation at site where injected This list may not describe all possible side effects. Call your doctor for medical advice about side effects. You may report side effects to FDA at 1-800-FDA-1088. Where should I keep my medicine? Keep out of the reach of children. Store this medicine in a refrigerator between 2 and 8 degrees C (36 and 46 degrees F). Do not freeze or use if the medicine has been frozen. Protect from light and excessive heat. Each single-dose pen or  prefilled syringe can be kept at room temperature, not to exceed 30 degrees C (86 degrees F) for a total of 14 days, if needed. Store in the carton until use. Throw away any unused medicine after the expiration date. NOTE: This sheet is a summary. It may not cover all possible information. If you have questions about this medicine, talk to your doctor, pharmacist, or health care provider.  2015, Elsevier/Gold Standard. (2013-07-04 13:53:28)

## 2015-01-11 NOTE — Assessment & Plan Note (Signed)
-   Initiated Trulicity 0.75 weekly after discussion with pharmacy - Recommended against using 70/30, especially in self-titrated doses due to multiple episodes of hypoglycemia and trips to the ED - Will call in 1-2 weeks to check on blood sugars - Follow up in 2months for A1C check

## 2015-01-11 NOTE — Progress Notes (Signed)
Subjective:     Patient ID: Diana Clayton, female   DOB: 1927/08/31, 81 y.o.   MRN: [id number]  HPI Diana Clayton is an 81yo female presenting today for diabetes management. - 70/30 discontinued in February due to multiple visits to ED with hypoglycemia. Metformin initiated and titrated up to 1000BID - Out of Metformin x1 week. Did not notify office - Has been taking left over 70/30 since she ran out of Metformin. States she usually takes 10 units in am and 15units in pm, but then admits she will add to it if she thinks her blood sugar is too high - Wishes to be restarted on 70/30 despite multiple trips to ED - Notes sugars in 200s, no episodes of hypoglycemia - Last A1C 7.8 in March  Review of Systems  Constitutional: Positive for fatigue.  Respiratory: Negative for cough, shortness of breath and wheezing.   Cardiovascular: Negative for chest pain.      Objective:   Physical Exam  Constitutional: She appears well-developed and well-nourished. No distress.  Cardiovascular: Normal rate and regular rhythm.  Exam reveals no gallop and no friction rub.   No murmur heard. Pulmonary/Chest: Effort normal. No respiratory distress. She has no wheezes. She has no rales.  Abdominal: Soft. She exhibits no distension. There is no tenderness.  Musculoskeletal: She exhibits no edema.  Psychiatric: She has a normal mood and affect. Her behavior is normal.      Assessment:     Please refer to Problem List for Assessment.    Plan:     Please refer to Problem List for Plan.

## 2015-01-21 ENCOUNTER — Telehealth: Payer: Self-pay | Admitting: Family Medicine

## 2015-01-21 ENCOUNTER — Other Ambulatory Visit: Payer: Self-pay | Admitting: *Deleted

## 2015-01-21 MED ORDER — FUROSEMIDE 40 MG PO TABS: 80.0000 mg | ORAL_TABLET | Freq: Every day | ORAL | Status: DC

## 2015-01-21 MED ORDER — ATENOLOL 50 MG PO TABS: 50.0000 mg | ORAL_TABLET | Freq: Every day | ORAL | Status: DC

## 2015-01-21 NOTE — Telephone Encounter (Signed)
Received telephone call from daughter reporting increased urination. Incontinence due to not being able to get to restroom in time. Currently prescribed Furosemide 80mg  daily--spaced dose out to every other day on their own.   Contacted Mrs. Beeks concerning above complains. States increased urination and incontinence has resolved since yesterday. Changed does of Furosemide to every other day last week and symptoms have been improving since that time. Will decrease dose of Lasix to 40mg  daily--given permission to space out to every other day if urinary frequency increases. Discussed signs of heart failure and to call clinic if she develops worsening shortness of breath or edema.   Recently prescribed Trulicity. States she has been taking it weekly on Wednesdays. Has not noted any episodes of hypoglycemia. Averages 160s-170s, improved from 200s prior to addition of Trulicity. Will continue current treatment. Will contact in 1-2 weeks to discuss sugars and consider increasing dose of Trulicity at that time.  Dr. Caroleen Hammanumley

## 2015-01-21 NOTE — Telephone Encounter (Signed)
Patient's daughter called requesting refills and to discuss pt's possible having incontinence and dosage of the Lasix.  Pt is taking Lasix every other day instead of every day as prescribed.  Pt is worry about going out because using the bathroom so often.  At times she can't even to the bathroom before voiding on herself.  Please give pt's daughter a call as soon as possible.  Clovis PuMartin, Tamika L, RN

## 2015-01-28 ENCOUNTER — Telehealth: Payer: Self-pay | Admitting: Family Medicine

## 2015-01-28 NOTE — Telephone Encounter (Signed)
Patient's daughter is calling because the patient needs any long-term prescriptions to be sent through [facility] from here moving forward; That phone number for [facility] is [phone].

## 2015-01-28 NOTE — Telephone Encounter (Signed)
Will forward to MD to send patient's long term meds to this pharmacy.  Kaelynne Christley,CMA

## 2015-02-04 ENCOUNTER — Telehealth: Payer: Self-pay | Admitting: *Deleted

## 2015-02-04 NOTE — Telephone Encounter (Signed)
Pt's daughte called requesting a refill on Trulicity to be sent to [facility] [place].  Pt is due for injection tomorrow.  The refill sent to [facility] has not came in yet.  Please send in one pen until they can get home this weekend.

## 2015-02-05 MED ORDER — DULAGLUTIDE 0.75 MG/0.5ML ~~LOC~~ SOAJ: 0.7500 mg | SUBCUTANEOUS | Status: DC

## 2015-02-05 NOTE — Telephone Encounter (Signed)
Prescription sent

## 2015-02-12 ENCOUNTER — Other Ambulatory Visit: Payer: Self-pay | Admitting: Family Medicine

## 2015-02-12 ENCOUNTER — Telehealth: Payer: Self-pay | Admitting: Family Medicine

## 2015-02-12 MED ORDER — FLUTICASONE-SALMETEROL 250-50 MCG/DOSE IN AEPB: 1.0000 | INHALATION_SPRAY | Freq: Two times a day (BID) | RESPIRATORY_TRACT | Status: AC

## 2015-02-12 MED ORDER — DULAGLUTIDE 0.75 MG/0.5ML ~~LOC~~ SOAJ: 0.7500 mg | SUBCUTANEOUS | Status: DC

## 2015-02-12 MED ORDER — ATENOLOL 50 MG PO TABS: 50.0000 mg | ORAL_TABLET | Freq: Every day | ORAL | Status: DC

## 2015-02-12 MED ORDER — FUROSEMIDE 40 MG PO TABS: 80.0000 mg | ORAL_TABLET | Freq: Every day | ORAL | Status: DC

## 2015-02-12 MED ORDER — ASPIRIN EC 81 MG PO TBEC: 81.0000 mg | DELAYED_RELEASE_TABLET | Freq: Every day | ORAL | Status: AC

## 2015-02-12 MED ORDER — METFORMIN HCL 500 MG PO TABS: 1000.0000 mg | ORAL_TABLET | Freq: Two times a day (BID) | ORAL | Status: DC

## 2015-02-12 NOTE — Telephone Encounter (Signed)
States she is about to run out of most of her medications. Will contact [facility] and make sure they still have the prescriptions. [facility]. States blood sugars have been between 100 and 170s. Will follow up blood sugar log at next office visit.

## 2015-03-15 ENCOUNTER — Ambulatory Visit (INDEPENDENT_AMBULATORY_CARE_PROVIDER_SITE_OTHER): Payer: Medicare Other | Admitting: Family Medicine

## 2015-03-15 ENCOUNTER — Encounter: Payer: Self-pay | Admitting: Family Medicine

## 2015-03-15 VITALS — BP 120/68 | HR 80 | Temp 98.2°F | Ht 70.0 in | Wt 202.2 lb

## 2015-03-15 DIAGNOSIS — R739 Hyperglycemia, unspecified: Secondary | ICD-10-CM | POA: Diagnosis not present

## 2015-03-15 DIAGNOSIS — E119 Type 2 diabetes mellitus without complications: Secondary | ICD-10-CM | POA: Diagnosis present

## 2015-03-15 LAB — POCT GLYCOSYLATED HEMOGLOBIN (HGB A1C): Hemoglobin A1C: 9.7

## 2015-03-15 LAB — GLUCOSE, CAPILLARY: Glucose-Capillary: 346 mg/dL — ABNORMAL HIGH (ref 65–99)

## 2015-03-15 MED ORDER — DULAGLUTIDE 0.75 MG/0.5ML ~~LOC~~ SOAJ: 1.5000 mg | SUBCUTANEOUS | Status: DC

## 2015-03-15 MED ORDER — DULAGLUTIDE 1.5 MG/0.5ML ~~LOC~~ SOAJ
1.5000 mg | SUBCUTANEOUS | Status: DC
Start: 2015-03-15 — End: 2016-06-30

## 2015-03-15 MED ORDER — LISINOPRIL 5 MG PO TABS: 5.0000 mg | ORAL_TABLET | Freq: Every day | ORAL | Status: DC

## 2015-03-15 MED ORDER — INSULIN DETEMIR 100 UNIT/ML ~~LOC~~ SOLN: 10.0000 [IU] | Freq: Every day | SUBCUTANEOUS | Status: DC

## 2015-03-15 NOTE — Progress Notes (Signed)
   Subjective:    Patient ID: Diana RosenthalAugusta Ales, female    DOB: 1927-07-24, 81 y.o.   MRN: [id number]  Patient presents for a same day appointment.  HPI  HYPERGLYCEMIA / CHRONIC DM, Type 2: Reports concern with checked CBG at 455 this morning, presents today for evaluation. - Interval course, previously on 70/30 insulin chronically until Feb 2016 discontinued due to multiple ED visits with hypoglycemia after self-titrating dose. Started on Metformin 1000mg  BID, and last visit with PCP 01/09/15 started on Trulicity 0.75mg  weekly. Overall has reported improved CBGs via telephone conversations with PCP in range of 100 to 170s about 1 month ago. CBGs: Does not have detailed CBG log but has glucometer with 1 week readings. Avg 300 (reported past 1 month), Low 260, High 455. Checks CBGs 2-3x daily (fasting, 2 hr postprandial) Meds: Trulicity 0.75mg  sq weekly,  Metformin 1000mg  BID Reports good compliance. Tolerating well w/o side-effects Not on ACEi / ARB - no reported prior start or allergies, Lifestyle: Diet (tries to reduce carbs) No regular exercise, uses walker - Admits to some polyuria occasional Denies hypoglycemia, visual changes, numbness or tingling.  PMH: HFpEF with grade 2 diastolic dysfunction, LVEF 55-60%, last ECHO 09/2014  I have reviewed and updated the following as appropriate: allergies and current medications  Social Hx: - Former smoker - Daughter assists with injections, primary caregiver  Review of Systems  See above HPI    Objective:   Physical Exam  BP 120/68 mmHg  Pulse 80  Temp(Src) 98.2 F (36.8 C) (Oral)  Ht 5\' 10"  (1.778 m)  Wt 202 lb 3.2 oz (91.717 kg)  BMI 29.01 kg/m2  Gen - well-appearing, comfortable, elderly F, NAD HEENT - oropharynx clear, MMM Heart - RRR, no murmurs heard Lungs - CTAB, no wheezing, crackles, or rhonchi. Normal work of breathing. Abd - soft, NTND, no masses, +active BS Ext - non-tender, no significant edema, peripheral pulses  intact +2 b/l Skin - warm, dry, no rashes Neuro - awake, alert, oriented, grossly non-focal, gait normal with walker    Assessment & Plan:   See specific A&P problem list for details.

## 2015-03-15 NOTE — Patient Instructions (Addendum)
Dear Diana RosenthalAugusta Clayton, Thank you for coming in to clinic today. It was good to see you!  1. Your blood sugars have been elevated over past 1 month. 2. Increased dose of Trulicity injection - continue Wednesday once weekly - finish your old supply by using TWO injections back to back 0.75mg  dose per injection. - Sent new refill to [facility] for Trulicity 1.5mg  (twice as strong) - inject new medicine ONCE weekly (on Wednesday) 3. Sent new rx to [facility] for Levemir Insulin - inject 10 units daily in morning after breakfast. - Keep checking your blood sugars as you are, WRITE down all numbers, check 3 times daily - Dr. Caroleen Hammanumley will call you within 1-2 weeks to discuss blood sugars - Started Lisinopril 5mg  - take once a day for BP and Kidneys  Some important numbers from today's visit: HgA1C - 9.7 BP - 120/68 Results -  Please schedule a follow-up appointment with Dr. Caroleen Hammanumley or myself in 1 to 2 weeks to re-check blood sugar and Blood Work in  If you have any other questions or concerns, please feel free to call the clinic to contact me. You may also schedule an earlier appointment if necessary.  However, if your symptoms get significantly worse, please go to the Emergency Department to seek immediate medical attention.  Saralyn PilarAlexander Karamalegos, DO [facility]

## 2015-03-15 NOTE — Assessment & Plan Note (Signed)
Worsening control over past 3 months. Discontinued off insulin 70/30 in Feb 2016 d/t hypoglycemia hospitalizations. On Trulicity and metformin, no insulin. Current HgbA1c 9.7 (7/1), last HgbA1c 7.8 (11/2014). Hyperglycemia over past 1 month, 300 to 400 No complications or hypoglycemia. Reviewed case with PCP Dr. Caroleen Hammanumley  Plan:  1. Increase Trulicity from 0.75mg  weekly (weds) to 1.5mg  weekly (finish old 0.75 injections, then sent new rx to mail order rx - detailed instructions provided) 2. Start Levemir insulin 10u daily after breakfast 9-10am. ([facility] to [facility] up today) 3. Continue check CBG TID, record values bring log to next visit 4. Follow DM diet 5. Start Lisinopril 5mg  daily (not on ACEi/ARB previously, no allergies listed, also HFpEF) 6. RTC 1-2 weeks to check BMET, f/u K and SCr after start ACE, follow-up CBG log, titrate Levemir if needed. Also PCP to contact patient to check within 1-2 weeks on CBGs

## 2015-03-21 ENCOUNTER — Telehealth: Payer: Self-pay | Admitting: Family Medicine

## 2015-03-21 NOTE — Telephone Encounter (Signed)
Please call Mrs. Hew regarding medication for her diabetes.

## 2015-03-22 NOTE — Telephone Encounter (Signed)
Currently prescribed Trulicity 1.5mg  weekly, Levemir 10 units daily, Metformin 100mg  BID. Was very confused concerning medications. Has not yet started Levemir because she though she only took it when she ran out of Trulicity. Clarified that a new dose of Trulicity has been sent in, however she can finish what she has at home and then begin taking new dose of Trulicity when she runs out, only on Wednesday. Will take Levemir 10 units starting tomorrow after breakfast. Continue Metformin. Office visit with me scheduled for 03/29/15.  Able to repeat back plan. No further questions.  Dr. Caroleen Hammanumley

## 2015-03-29 ENCOUNTER — Encounter: Payer: Self-pay | Admitting: Family Medicine

## 2015-03-29 ENCOUNTER — Ambulatory Visit (INDEPENDENT_AMBULATORY_CARE_PROVIDER_SITE_OTHER): Payer: Medicare Other | Admitting: Family Medicine

## 2015-03-29 VITALS — BP 125/60 | HR 89 | Temp 98.3°F | Ht 70.0 in | Wt 203.0 lb

## 2015-03-29 DIAGNOSIS — Z79899 Other long term (current) drug therapy: Secondary | ICD-10-CM

## 2015-03-29 DIAGNOSIS — E119 Type 2 diabetes mellitus without complications: Secondary | ICD-10-CM

## 2015-03-29 DIAGNOSIS — Z5181 Encounter for therapeutic drug level monitoring: Secondary | ICD-10-CM

## 2015-03-29 DIAGNOSIS — I10 Essential (primary) hypertension: Secondary | ICD-10-CM | POA: Diagnosis not present

## 2015-03-29 LAB — GLUCOSE, CAPILLARY: Glucose-Capillary: 164 mg/dL — ABNORMAL HIGH (ref 65–99)

## 2015-03-29 LAB — BASIC METABOLIC PANEL
BUN: 24 mg/dL — AB (ref 6–23)
CHLORIDE: 92 meq/L — AB (ref 96–112)
CO2: 27 mEq/L (ref 19–32)
Calcium: 8.8 mg/dL (ref 8.4–10.5)
Creat: 1.13 mg/dL — ABNORMAL HIGH (ref 0.50–1.10)
Glucose, Bld: 162 mg/dL — ABNORMAL HIGH (ref 70–99)
Potassium: 3.5 mEq/L (ref 3.5–5.3)
Sodium: 135 mEq/L (ref 135–145)

## 2015-03-29 NOTE — Patient Instructions (Signed)
Thank you so much for coming to visit me today! Please increase your Levemir (the daily shot) to 15units every day. Continue to take your Metformin twice a day. I will call you on Tuesday to see what your sugars are and to decide if we need to lower your Levemir dose before giving the higher dose of Trulicity (the weekly shot). Please continue to check your blood sugars three times a day.  We will check your electrolytes (sodium and potassium) today since the medication started at your last visit can effect these. I will let you know the results when I call on Tuesday.  Please only take one tablet of your Furosemide (Lasix) instead of two. If you develop shortness of breath, you start to gain weight, or your swelling worsens, please start taking two again and give me a call.  Thanks again!  Dr. Caroleen Hammanumley  Diabetes Regimen: Levemir 15units every morning. Trulicity 1.5 weekly Metformin two tablets twice a day

## 2015-03-29 NOTE — Progress Notes (Signed)
Subjective:     Patient ID: Diana RosenthalAugusta Clayton, female   DOB: [birth year], 81 y.o.   MRN: [id number]  HPI Diana Clayton is an 81yo female presenting today for Diabetes management. - States sugars have been in the 300-400s over the last week. Sugar was 296 this am. Checks her blood sugars three times a day. - Denies hypoglycemia - Has been taking Metformin 1000mg  BID. Dose of Trulicity increased to 1.5 at last office visit, however she has been taking her previous dose until she runs out--should start new dose next Wednesday. Also started Levemir 10units daily with breakfast at the beginning of this week. - Did not eat a normal lunch today, eating only a peach. Normally she eats a full meal. CBG of 164 noted in clinic, states this is the lowest it's been in weeks. - Also states her arms seem more dry and shrunken than normal. Denies edema, shortness of breath, weight gain. Currently takes 80mg  Furosemide daily. - No further concerns  Review of Systems  Respiratory: Negative for shortness of breath and wheezing.   Cardiovascular: Negative for chest pain.      Objective:   Physical Exam  Constitutional: She is oriented to person, place, and time. She appears well-developed and well-nourished. No distress.  HENT:  Head: Normocephalic and atraumatic.  Dry mucous membranes  Cardiovascular: Normal rate and regular rhythm.  Exam reveals no gallop and no friction rub.   No murmur heard. Pulmonary/Chest: Effort normal and breath sounds normal. No respiratory distress. She has no wheezes.  Abdominal: Soft. She exhibits no distension. There is no tenderness.  Musculoskeletal: She exhibits no edema.  Neurological: She is alert and oriented to person, place, and time.  Skin: Skin is dry.  Decreased skin turgor      Assessment:     Please refer to Problem List for Assessment.    Plan:     Please refer to Problem List for Plan.

## 2015-03-30 NOTE — Assessment & Plan Note (Signed)
-   Will increase Levemir to 15units daily with breakfast - Counseled on symptoms of hypoglycemia and what to do in that situation - Will contact on Tuesday 7/19 to see what her blood sugars are on new dose. May need to adjust dose prior to anticipated increase in Trulicity on Wednesday - Counseled multiple times on which medications she is taking and when these should be taken. Was able to repeat back regimen. Very easily confused. - Will check BMP following initiation of ACE inhibitor at last office visit

## 2015-04-01 MED ORDER — INSULIN DETEMIR 100 UNIT/ML ~~LOC~~ SOLN: 15.0000 [IU] | Freq: Every day | SUBCUTANEOUS | Status: DC

## 2015-04-01 MED ORDER — FUROSEMIDE 40 MG PO TABS: 40.0000 mg | ORAL_TABLET | Freq: Every day | ORAL | Status: DC

## 2015-04-01 NOTE — Assessment & Plan Note (Signed)
-   Decrease dose of Lasix to  (1 tablet) from  (2 tablets). Chart review shows dose was decreased in past and was never documented as being increased again,however Diana Clayton reports she is still taking 2 tablets ever day. - Discussed if shortness of breath, weight gain, or worsening edema occurs, to increase dose back up to 2 tablets and give the office a call

## 2015-04-02 ENCOUNTER — Telehealth: Payer: Self-pay | Admitting: Family Medicine

## 2015-04-02 NOTE — Telephone Encounter (Signed)
Sugars have been in the 230s-260s after increasing dose of Levemir to 15units daily. Lowest 230.  Will increase dose of Trulicity tomorrow. Will contact again next week to follow up blood sugars. Reports some mild shortness of breath today, but denies edema. Instructed to increase Lasix back to two tablets if shortness of breath worsens or continues into tomorrow.  Dr. Caroleen Hammanumley

## 2015-04-26 ENCOUNTER — Ambulatory Visit: Payer: Medicare Other | Admitting: Podiatry

## 2015-04-26 ENCOUNTER — Ambulatory Visit (INDEPENDENT_AMBULATORY_CARE_PROVIDER_SITE_OTHER): Payer: Medicare Other | Admitting: Podiatry

## 2015-04-26 ENCOUNTER — Encounter: Payer: Self-pay | Admitting: Podiatry

## 2015-04-26 VITALS — BP 109/61 | HR 92 | Resp 18

## 2015-04-26 DIAGNOSIS — B351 Tinea unguium: Secondary | ICD-10-CM | POA: Diagnosis not present

## 2015-04-26 DIAGNOSIS — M79676 Pain in unspecified toe(s): Secondary | ICD-10-CM | POA: Diagnosis not present

## 2015-04-26 DIAGNOSIS — E1151 Type 2 diabetes mellitus with diabetic peripheral angiopathy without gangrene: Secondary | ICD-10-CM | POA: Diagnosis not present

## 2015-04-26 NOTE — Progress Notes (Signed)
Patient ID: Diana Clayton, female   DOB: 08-11-1927, 81 y.o.   MRN: [id number] Complaint:  Visit Type: Patient returns to my office for continued preventative foot care services. Complaint: Patient states" my nails have grown long and thick and become painful to walk and wear shoes" Patient has been diagnosed with DM with no foot complications. The patient presents for preventative foot care services. No changes to ROS  Podiatric Exam: Vascular: dorsalis pedis  pulses are palpable bilateral. Posterior tibial pulses are not palpable. Capillary return is immediate. Temperature gradient is WNL.  Sensorium: Normal Semmes Weinstein monofilament test. Normal tactile sensation bilaterally. Nail Exam: Pt has thick disfigured discolored nails with subungual debris noted bilateral entire nail hallux through fifth toenails Ulcer Exam: There is no evidence of ulcer or pre-ulcerative changes or infection. Orthopedic Exam: Muscle tone and strength are WNL. No limitations in general ROM. No crepitus or effusions noted. Foot type and digits show no abnormalities. Bony prominences are unremarkable. Skin: No Porokeratosis. No infection or ulcers.  IPK sub 4th right foot.  Heloma durum B/L  Diagnosis:  Onychomycosis, , Pain in right toe, pain in left toes  Treatment & Plan Procedures and Treatment: Consent by patient was obtained for treatment procedures. The patient understood the discussion of treatment and procedures well. All questions were answered thoroughly reviewed. Debridement of mycotic and hypertrophic toenails, 1 through 5 bilateral and clearing of subungual debris. No ulceration, no infection noted.  Return Visit-Office Procedure: Patient instructed to return to the office for a follow up visit 3 months for continued evaluation and treatment.

## 2015-07-26 ENCOUNTER — Ambulatory Visit: Payer: Medicare Other | Admitting: Podiatry

## 2015-07-31 ENCOUNTER — Encounter: Payer: Self-pay | Admitting: Podiatry

## 2015-07-31 ENCOUNTER — Ambulatory Visit (INDEPENDENT_AMBULATORY_CARE_PROVIDER_SITE_OTHER): Payer: Medicare Other | Admitting: Podiatry

## 2015-07-31 DIAGNOSIS — B351 Tinea unguium: Secondary | ICD-10-CM

## 2015-07-31 DIAGNOSIS — M79676 Pain in unspecified toe(s): Secondary | ICD-10-CM

## 2015-07-31 DIAGNOSIS — L84 Corns and callosities: Secondary | ICD-10-CM

## 2015-07-31 NOTE — Progress Notes (Signed)
Patient ID: Willodean RosenthalAugusta Joshi, female   DOB: May 14, 1927, 81 y.o.   MRN: [id number] Complaint:  Visit Type: Patient returns to my office for continued preventative foot care services. Complaint: Patient states" my nails have grown long and thick and become painful to walk and wear shoes" Patient has been diagnosed with DM with no foot complications. The patient presents for preventative foot care services. No changes to ROS  Podiatric Exam: Vascular: dorsalis pedis  pulses are palpable bilateral. Posterior tibial pulses are not palpable. Capillary return is immediate. Temperature gradient is WNL.  Sensorium: Normal Semmes Weinstein monofilament test. Normal tactile sensation bilaterally. Nail Exam: Pt has thick disfigured discolored nails with subungual debris noted bilateral entire nail hallux through fifth toenails Ulcer Exam: There is no evidence of ulcer or pre-ulcerative changes or infection. Orthopedic Exam: Muscle tone and strength are WNL. No limitations in general ROM. No crepitus or effusions noted. Foot type and digits show no abnormalities. Bony prominences are unremarkable. Skin: No Porokeratosis. No infection or ulcers.  IPK sub 4th right foot.  Heloma durum B/L  Diagnosis:  Onychomycosis, , Pain in right toe, pain in left toes, Corns fifth B/L  Treatment & Plan Procedures and Treatment: Consent by patient was obtained for treatment procedures. The patient understood the discussion of treatment and procedures well. All questions were answered thoroughly reviewed. Debridement of mycotic and hypertrophic toenails, 1 through 5 bilateral and clearing of subungual debris. No ulceration, no infection noted. Debride corns B/L Return Visit-Office Procedure: Patient instructed to return to the office for a follow up visit 3 months for continued evaluation and treatment.  Helane GuntherGregory Natha Guin DPM

## 2015-09-20 ENCOUNTER — Telehealth: Payer: Self-pay | Admitting: Family Medicine

## 2015-09-20 DIAGNOSIS — I10 Essential (primary) hypertension: Secondary | ICD-10-CM

## 2015-09-20 NOTE — Telephone Encounter (Signed)
Done

## 2015-09-20 NOTE — Telephone Encounter (Signed)
Daughter called because her mother needs a refill on her Lasix called in. They are out of there state. [place] and need this sent to [facility] [address] their number is [phone]. jw

## 2015-09-29 IMAGING — CT CT CHEST W/ CM
2 of 3 series · 15 of 36 positions shown, 18 images · IV contrast (OMNIPAQUE)
Comparison: None.

CLINICAL DATA: Short of breath.  Wheezing.  Symptoms for 2 weeks.

EXAM:
CT CHEST WITH CONTRAST
TECHNIQUE: Multidetector CT imaging of the chest was performed during
intravenous contrast administration.
CONTRAST:  100mL OMNIPAQUE IOHEXOL 300 MG/ML  SOLN

[Series 2: chest with st · axial · 0.69mm/px · z∈[-146,+84]mm · 12 of 55 slices shown, 15 images]
[im 5/55  mediastinal]
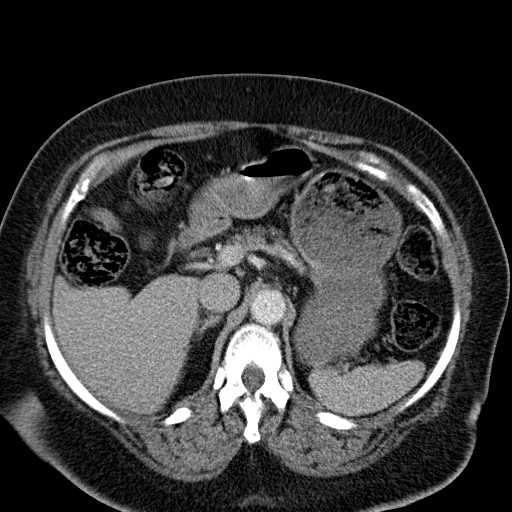
[im 5/55  lung]
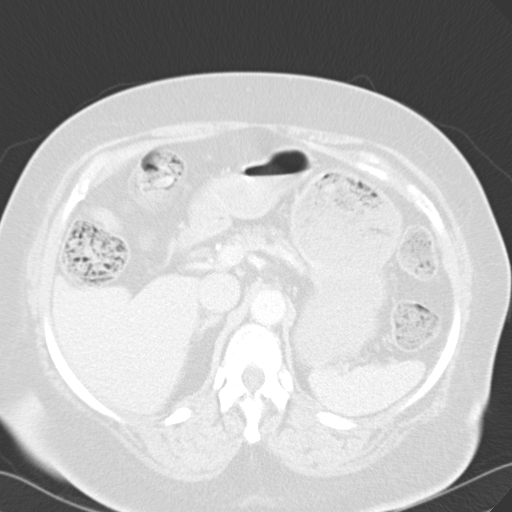
[im 9/55  lung]
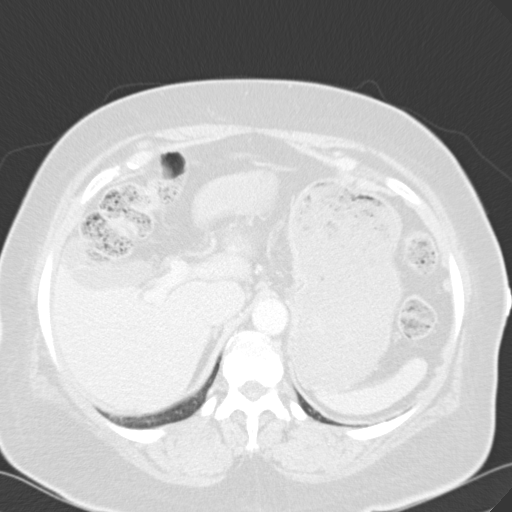
[im 13/55  lung]
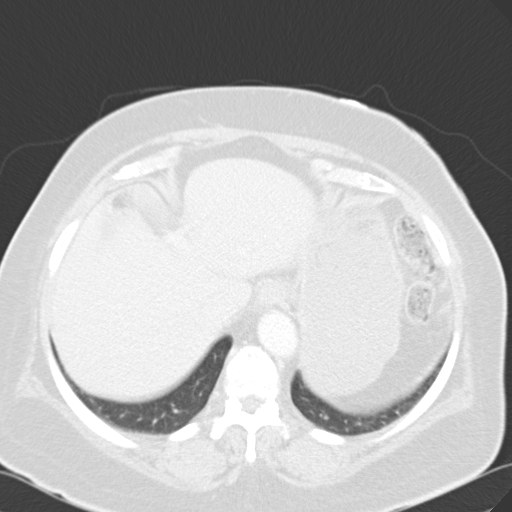
[im 17/55  lung]
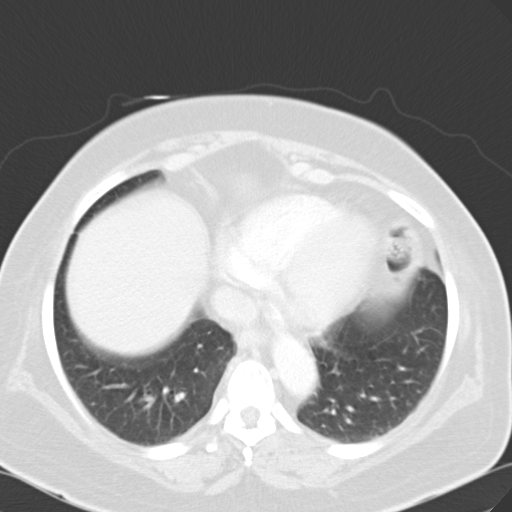
[im 21/55  mediastinal]
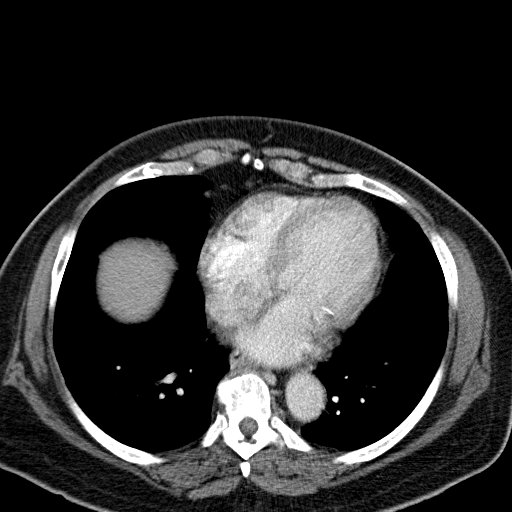
[im 21/55  lung]
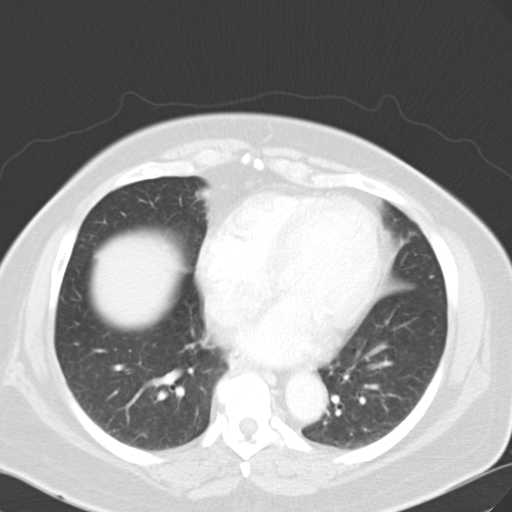
[im 25/55  lung]
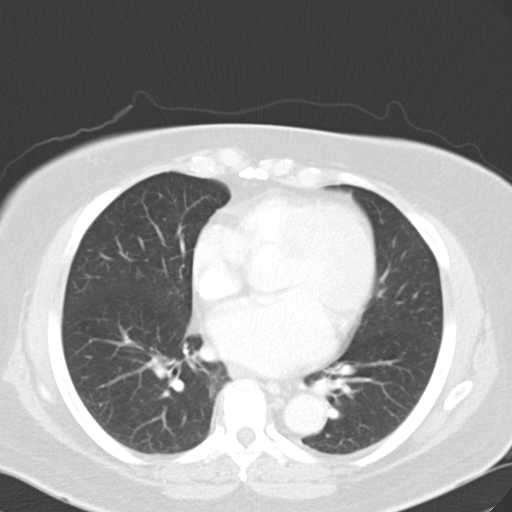
[im 31/55  lung]
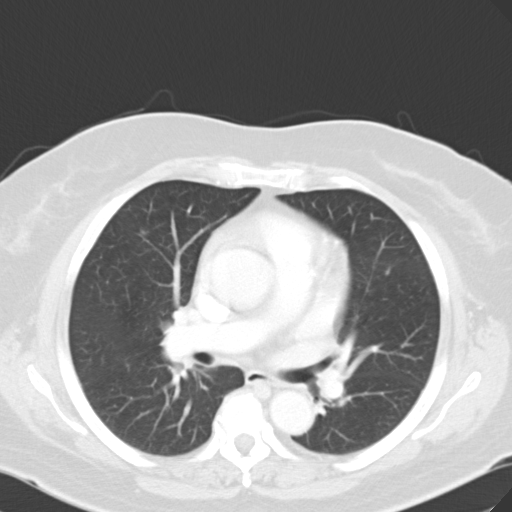
[im 35/55  lung]
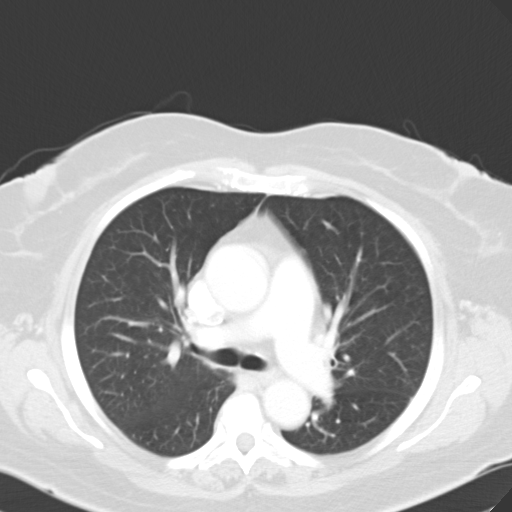
[im 39/55  mediastinal]
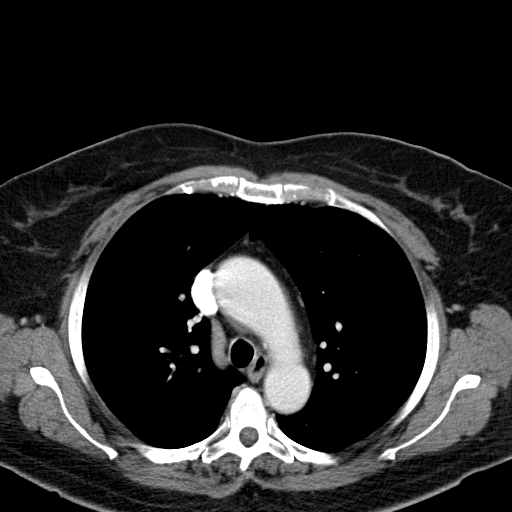
[im 39/55  lung]
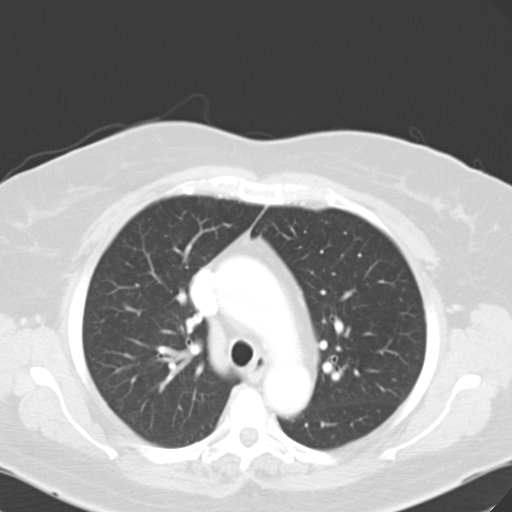
[im 43/55  lung]
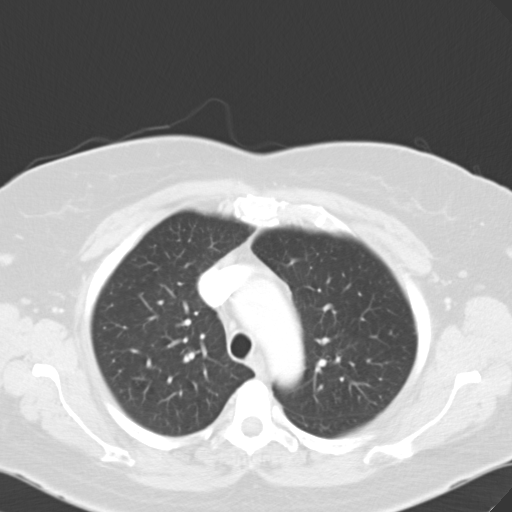
[im 47/55  lung]
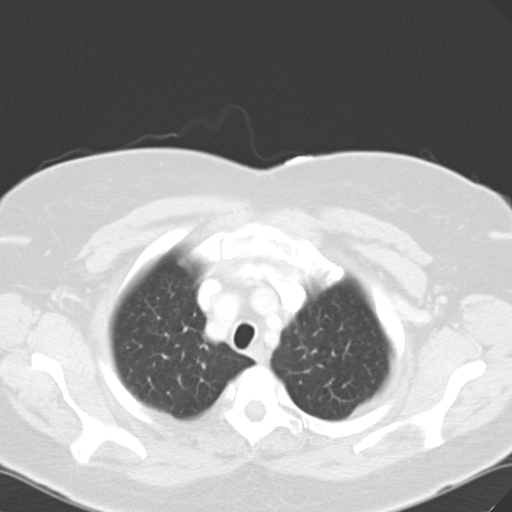
[im 51/55  lung]
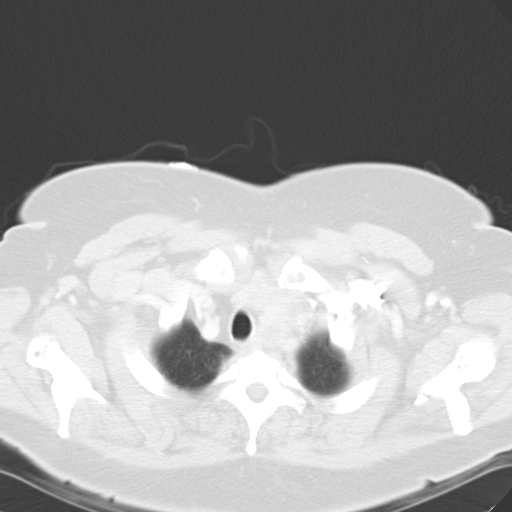

[Series 602: coronal · coronal · 0.69mm/px · 3 of 81 slices shown]
[im 17/81  lung]
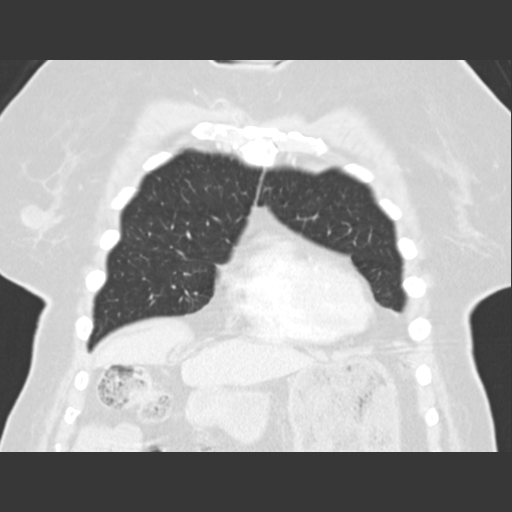
[im 33/81  lung]
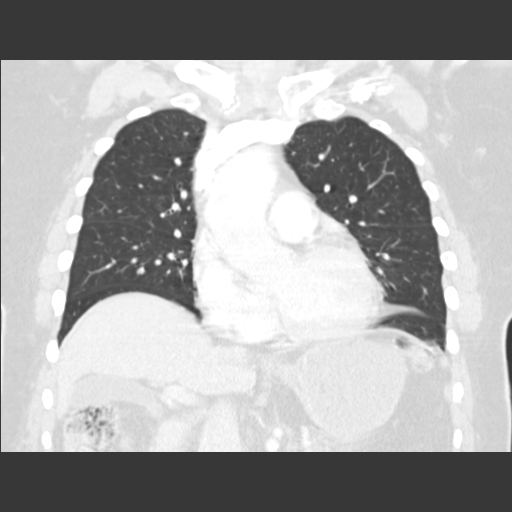
[im 49/81  lung]
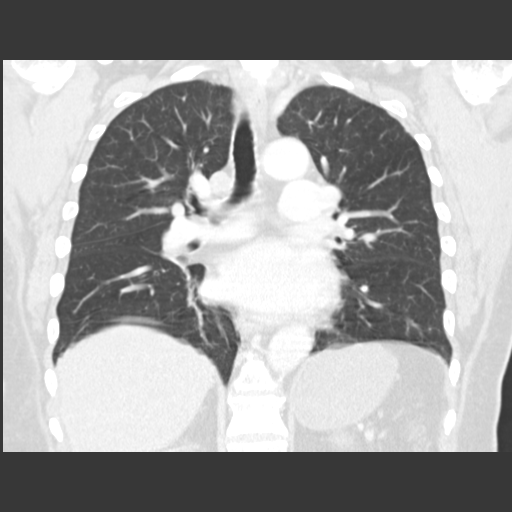

[15 of 36 positions shown; findings below may reference images not displayed]

FINDINGS: The left lobe of the thyroid gland is massively enlarged and
heterogeneous with areas of low density and calcification. A large
left thyroid nodule may be present.

No abnormal mediastinal adenopathy.  No pericardial effusion.

Central left upper lobe 12 mm pulmonary nodule on image 27. 4 mm
left lower lobe pulmonary nodule best seen on sagittal
reconstruction images. See image 84 of series 603.

No pneumothorax or pleural effusion.
IMPRESSION: Enlarged left thyroid lobe worrisome for a large left thyroid mass.
Thyroid ultrasound is recommended. This could potentially contribute
to breathing difficulties.

There are 2 left pulmonary nodules. The larger is 12 mm in the left
upper lobe. Malignancy is not excluded. PET-CT is recommended.

## 2015-10-01 IMAGING — US US SOFT TISSUE HEAD/NECK
1 series · 14 of 25 positions shown · non-contrast
Comparison: CT 10/01/2014

CLINICAL DATA: previous right hemithyroidectomy. left thyroid mass
noted on recent CT chest.

EXAM:
THYROID ULTRASOUND
TECHNIQUE: Ultrasound examination of the thyroid gland and adjacent soft
tissues was performed.

[Series 1: us soft tissue head/neck · 0.06mm/px · 14 of 44 slices shown]
[im 1/44]
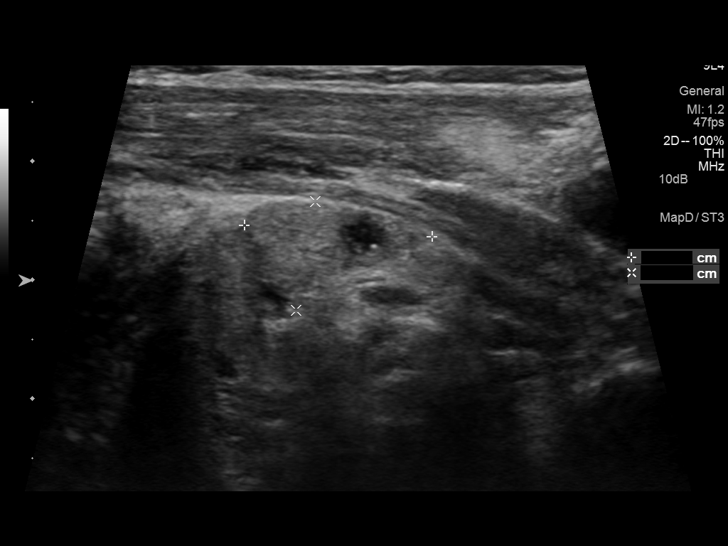
[im 4/44]
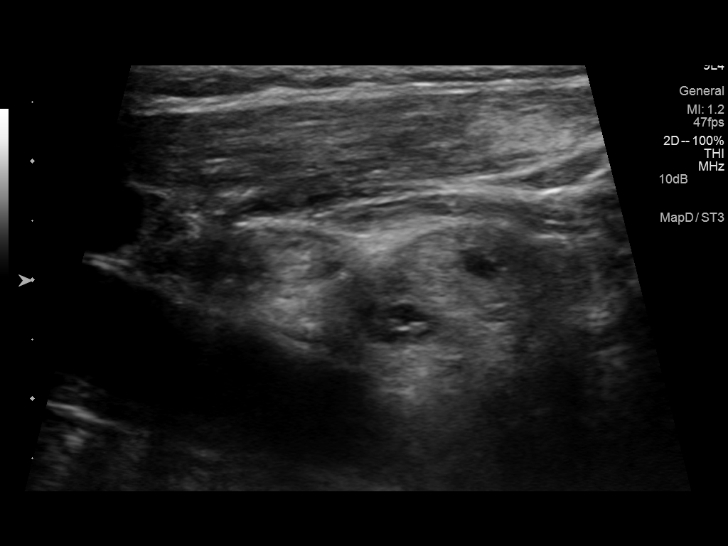
[im 8/44]
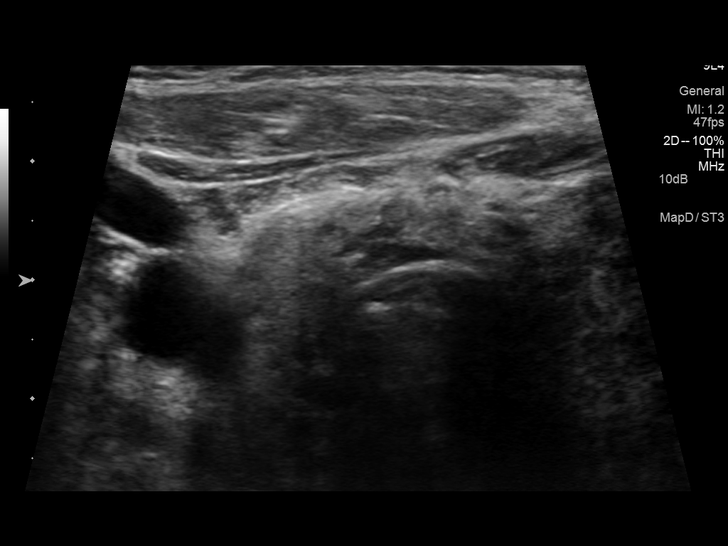
[im 11/44]
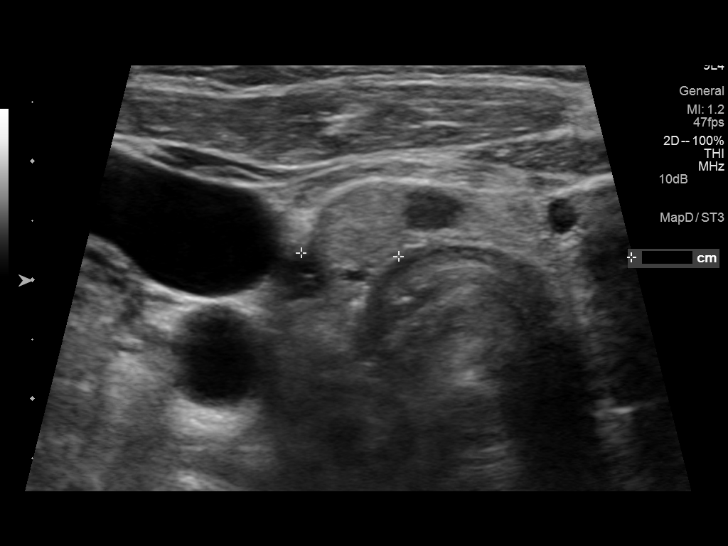
[im 15/44]
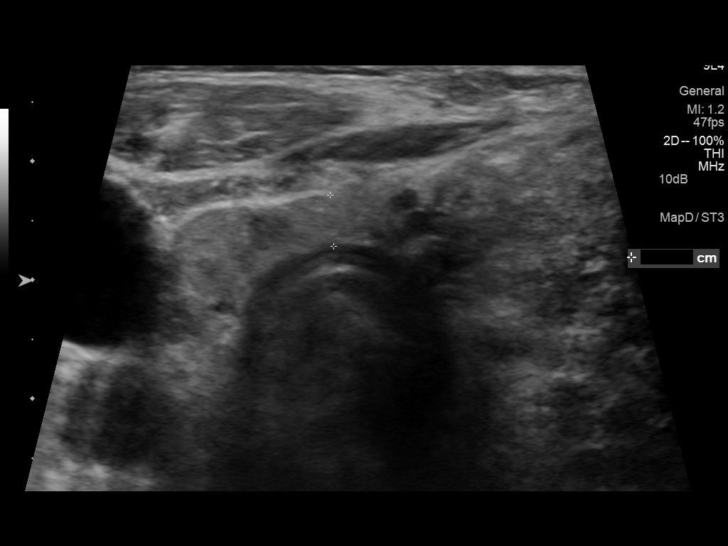
[im 17/44]
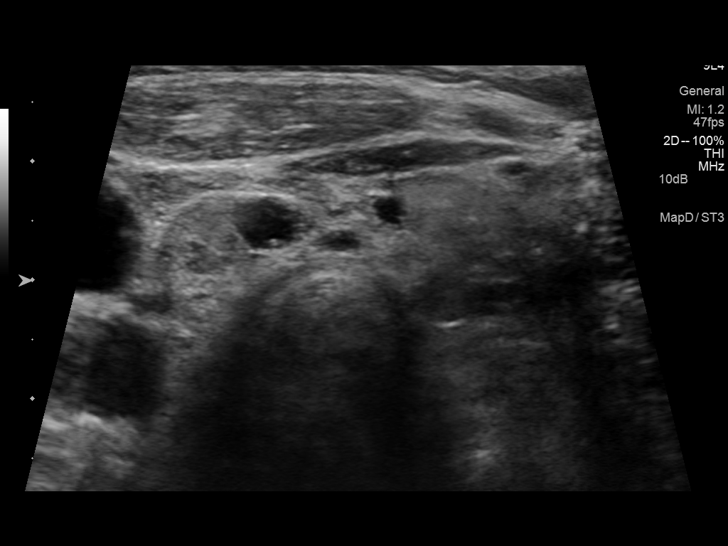
[im 20/44]
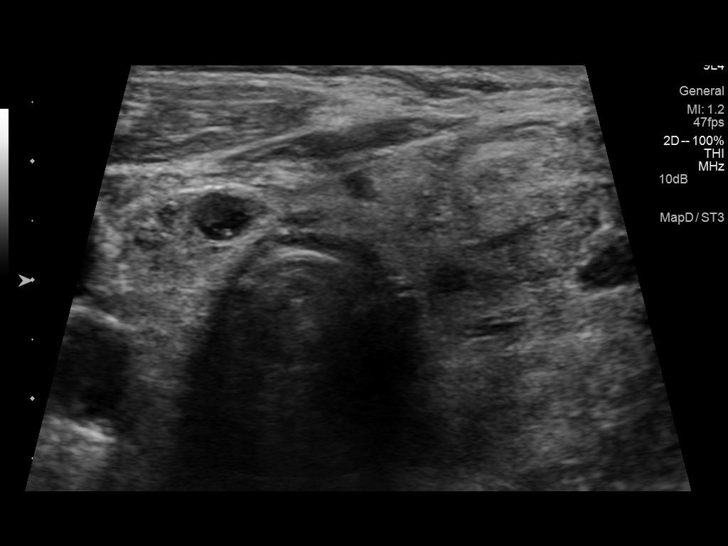
[im 24/44]
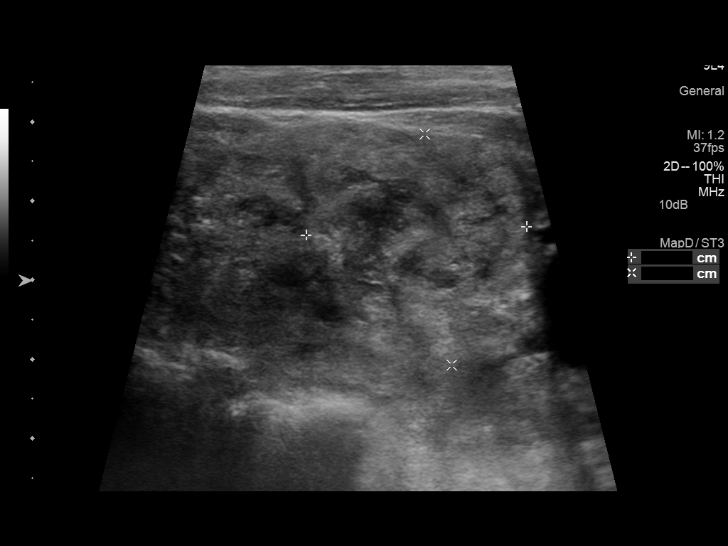
[im 27/44]
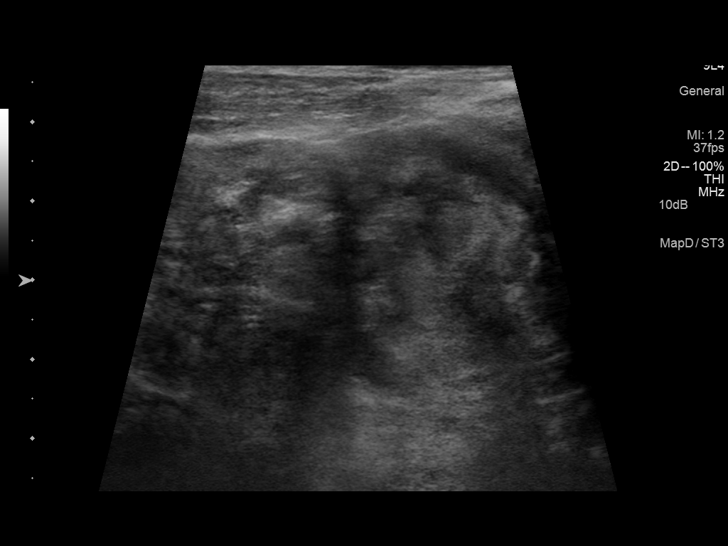
[im 29/44]
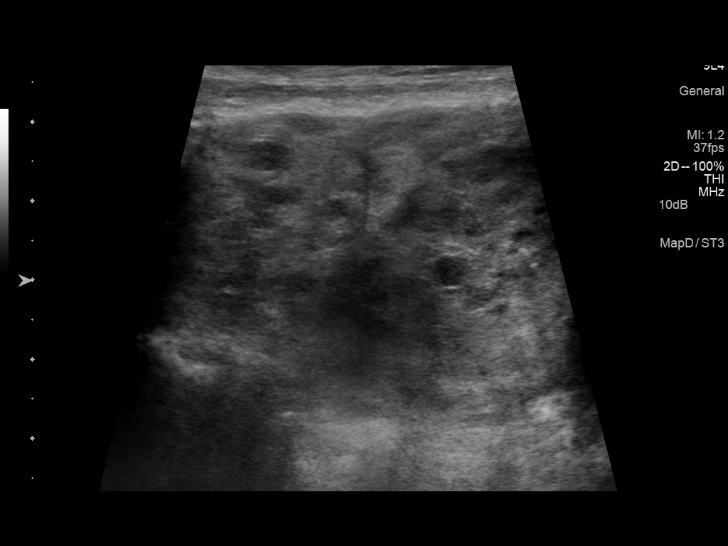
[im 33/44]
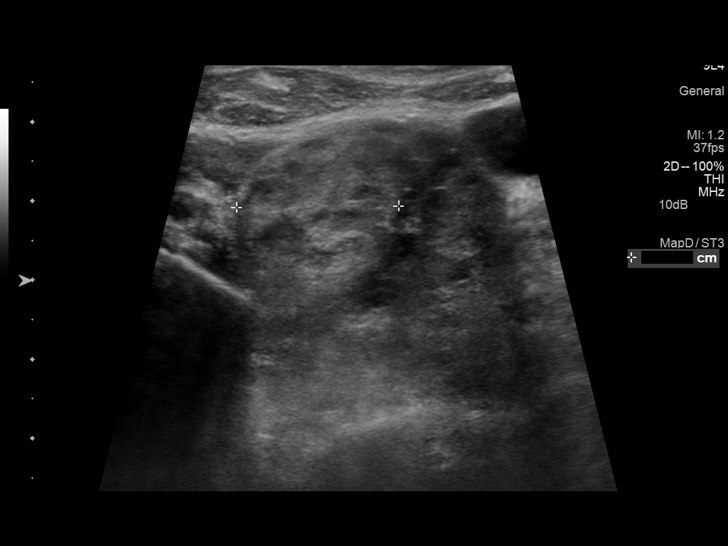
[im 36/44]
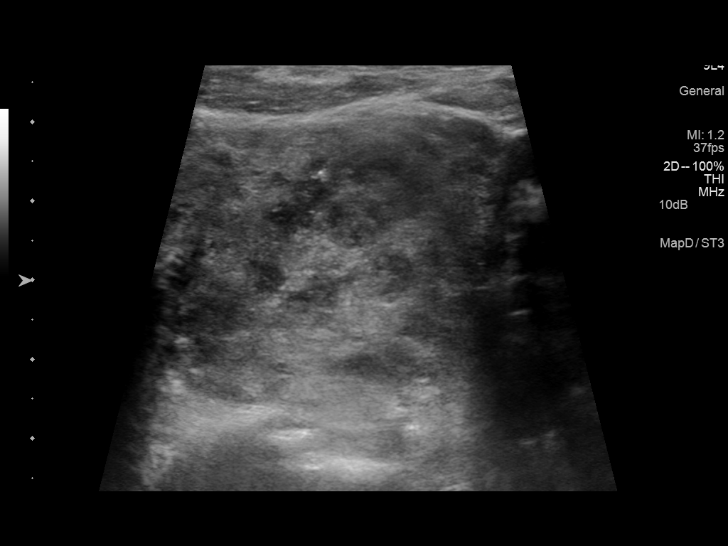
[im 40/44]
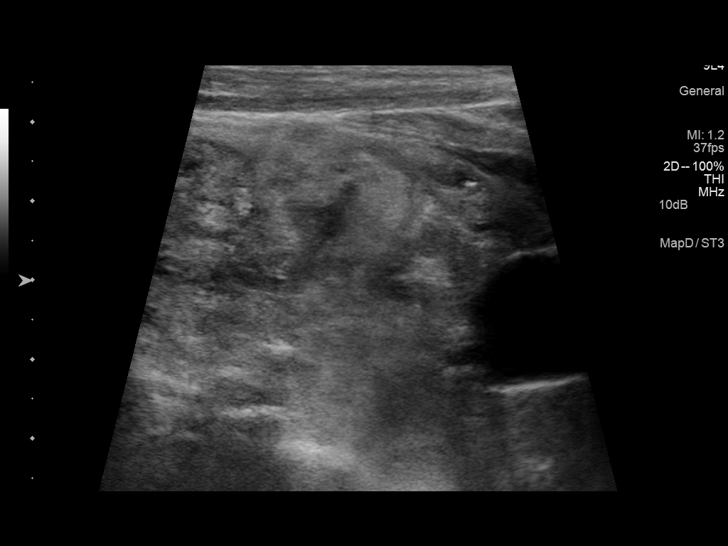
[im 44/44]
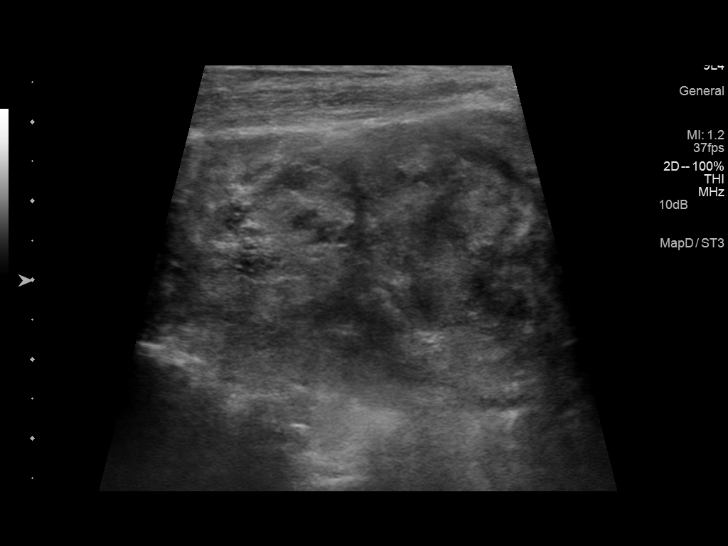

[14 of 25 positions shown; findings below may reference images not displayed]

FINDINGS: Right thyroid lobe

Measurements: 16 x 9 x 8 mm. 6 x 4 x 5 mm complex hypoechoic nodule,
deep mid.

Left thyroid lobe

Measurements: 72 x 43 x 42 mm. Inhomogeneous background echotexture.
Dominant 28 x 29 x 33 mm nodule, mid lobe. 23 x 18 x 21 mm solid
nodule, superior pole.

Isthmus

Thickness: 4.3 mm. 7 x 5 x 6 mm complex cystic nodule, right of
midline.

Lymphadenopathy

None visualized.
IMPRESSION: 1. Enlarged left lobe with 2 discrete nodules. Findings meet
consensus criteria for biopsy. Ultrasound-guided fine needle
aspiration recommended by the consensus statement: Management of
Thyroid Nodules Detected at US: Society of Radiologists in
Ultrasound Consensus Conference Statement. Radiology 5337;
[DATE]. Biopsy is scheduled.
2. Small residual/recurrent tissue in the right thyroidectomy bed,
containing small nodule.

## 2015-10-03 ENCOUNTER — Other Ambulatory Visit: Payer: Self-pay | Admitting: *Deleted

## 2015-10-04 MED ORDER — ALBUTEROL SULFATE (2.5 MG/3ML) 0.083% IN NEBU
2.5000 mg | INHALATION_SOLUTION | Freq: Four times a day (QID) | RESPIRATORY_TRACT | Status: DC | PRN
Start: 2015-10-04 — End: 2015-10-07

## 2015-10-04 NOTE — Telephone Encounter (Signed)
Please schedule follow-up appointment

## 2015-10-07 MED ORDER — ALBUTEROL SULFATE (2.5 MG/3ML) 0.083% IN NEBU: 2.5000 mg | INHALATION_SOLUTION | Freq: Four times a day (QID) | RESPIRATORY_TRACT | Status: DC | PRN

## 2015-10-07 NOTE — Addendum Note (Signed)
Addended by: Clovis Pu on: 10/07/2015 02:54 PM   Modules accepted: Orders

## 2015-10-07 NOTE — Telephone Encounter (Signed)
[facility] Pharmacist called stating they did not receive the refill request for neb solution.  Medication was approved for refill on 10/04/15 by Dr. Caroleen Hamman.  Rx stated print.  Rx resent electronically.  Clovis Pu, RN

## 2015-10-17 ENCOUNTER — Other Ambulatory Visit: Payer: Self-pay | Admitting: Family Medicine

## 2015-10-30 ENCOUNTER — Ambulatory Visit: Payer: Medicare Other | Admitting: Podiatry

## 2015-11-11 ENCOUNTER — Other Ambulatory Visit: Payer: Self-pay | Admitting: *Deleted

## 2015-11-12 MED ORDER — ALBUTEROL SULFATE (2.5 MG/3ML) 0.083% IN NEBU: 2.5000 mg | INHALATION_SOLUTION | Freq: Four times a day (QID) | RESPIRATORY_TRACT | Status: DC | PRN

## 2015-11-13 ENCOUNTER — Encounter: Payer: Self-pay | Admitting: Family Medicine

## 2015-11-13 ENCOUNTER — Ambulatory Visit (INDEPENDENT_AMBULATORY_CARE_PROVIDER_SITE_OTHER): Payer: Medicare Other | Admitting: Family Medicine

## 2015-11-13 VITALS — BP 157/65 | HR 74 | Temp 98.2°F | Ht 69.0 in | Wt 204.2 lb

## 2015-11-13 DIAGNOSIS — R600 Localized edema: Secondary | ICD-10-CM | POA: Diagnosis not present

## 2015-11-13 DIAGNOSIS — E119 Type 2 diabetes mellitus without complications: Secondary | ICD-10-CM

## 2015-11-13 DIAGNOSIS — I10 Essential (primary) hypertension: Secondary | ICD-10-CM | POA: Diagnosis not present

## 2015-11-13 DIAGNOSIS — Z794 Long term (current) use of insulin: Secondary | ICD-10-CM | POA: Diagnosis not present

## 2015-11-13 DIAGNOSIS — R32 Unspecified urinary incontinence: Secondary | ICD-10-CM

## 2015-11-13 LAB — POCT URINALYSIS DIPSTICK
BILIRUBIN UA: NEGATIVE
Blood, UA: NEGATIVE
GLUCOSE UA: NEGATIVE
Ketones, UA: NEGATIVE
LEUKOCYTES UA: NEGATIVE
NITRITE UA: NEGATIVE
Protein, UA: NEGATIVE
Spec Grav, UA: 1.015
UROBILINOGEN UA: 0.2
pH, UA: 7

## 2015-11-13 LAB — POCT GLYCOSYLATED HEMOGLOBIN (HGB A1C): Hemoglobin A1C: 6.8

## 2015-11-13 MED ORDER — SENNA 8.6 MG PO TABS: 1.0000 | ORAL_TABLET | Freq: Every day | ORAL | Status: AC

## 2015-11-13 NOTE — Patient Instructions (Signed)
Thank you so much for coming to visit me today! - We will check a BMP (electrolytes, kidney function), cholesterol levels, and urinalysis today. - If your urinalysis is normal, I will refer you to a urologist to further work up your urination problems. If it shows signs of infection, I will send in an antibiotic for you. - I have sent in a prescription for Senna for you to take daily for constipation. If you would rather try an over the counter medication, you may try Miralax. - Please take a 4 day break from Lasix. You may then start taking it every other day. If you notice worsening swelling or increase in weight, you may start taking it earlier than discussed. Please remember to drink plenty of fluids on this medication to prevent dehydration.  Thanks again! Dr. Caroleen Hamman

## 2015-11-14 LAB — BASIC METABOLIC PANEL WITH GFR
BUN: 23 mg/dL (ref 7–25)
CO2: 29 mmol/L (ref 20–31)
CREATININE: 1.11 mg/dL — AB (ref 0.60–0.88)
Calcium: 10 mg/dL (ref 8.6–10.4)
Chloride: 98 mmol/L (ref 98–110)
GFR, EST AFRICAN AMERICAN: 52 mL/min — AB (ref >=60)
GFR, EST NON AFRICAN AMERICAN: 45 mL/min — AB (ref >=60)
Glucose, Bld: 91 mg/dL (ref 65–99)
Potassium: 4.2 mmol/L (ref 3.5–5.3)
SODIUM: 139 mmol/L (ref 135–146)

## 2015-11-14 LAB — LIPID PANEL
Cholesterol: 160 mg/dL (ref 125–200)
HDL: 62 mg/dL (ref >=46)
LDL CALC: 87 mg/dL (ref <130)
TRIGLYCERIDES: 56 mg/dL (ref <150)
Total CHOL/HDL Ratio: 2.6 Ratio (ref <=5.0)
VLDL: 11 mg/dL (ref <30)

## 2015-11-14 NOTE — Progress Notes (Signed)
Subjective:     Patient ID: Diana Clayton, female   DOB: 07-04-1927, 82 y.o.   MRN: [id number]  HPI Mrs. Bunda is an 82yo female presenting today for diabetes follow up and incontinence. - Last A1C 9.7 in 7/016 - States she takes Levemir 15units, Trulicity 1.5 weekly, and Metformin - Denies hypoglycemia. States she averages around 150. - Reports increased urinary frequency. Occasionally with incontinence, however it has been several weeks since last episode. - Denies dysuria, urinary urgency - Prescribed Lasix  every other day. States she is taking this daily. Daughter states she has told her to take it every other day. - Reports if she doesn't take her lasix, lower extremity edema worsens. Also reports edema worse at the end of the day and improved with elevation. - Denies chest pain, shortness of breath, headache, saddle anesthesia, back pain  Review of Systems Per HPI. Other systems negative.    Objective:   Physical Exam  Constitutional: She appears well-developed. No distress.  HENT:  Head: Normocephalic and atraumatic.  Cardiovascular: Normal rate and regular rhythm.  Exam reveals no gallop and no friction rub.   No murmur heard. Pulmonary/Chest: Effort normal. No respiratory distress. She has no wheezes. She has no rales.  Abdominal: Soft. She exhibits no distension. There is no tenderness.  Musculoskeletal: She exhibits edema.  Neurological:  No saddle anesthesia noted.  Psychiatric: She has a normal mood and affect. Her behavior is normal.      Assessment and Plan:     DM2 (diabetes mellitus, type 2) - A1C improved to 6.8 from 9.7. - Continue Metformin, Trulicity - Lower Levemir to 12 units daily - Follow up in 3 months   Incontinence - Will obtain urinalysis - Hold lasix for 4 days and then resume every other day dosing. - Consider referral to urology  Edema - Suspect multifactorial given improvement with elevation and lasix - Recommend compression stockings  and elevation - Hold lasix per above. Consider discontinuing. - Recheck BMP

## 2015-11-17 DIAGNOSIS — R32 Unspecified urinary incontinence: Secondary | ICD-10-CM | POA: Insufficient documentation

## 2015-11-17 DIAGNOSIS — I509 Heart failure, unspecified: Secondary | ICD-10-CM | POA: Insufficient documentation

## 2015-11-17 DIAGNOSIS — E1122 Type 2 diabetes mellitus with diabetic chronic kidney disease: Secondary | ICD-10-CM | POA: Insufficient documentation

## 2015-11-17 DIAGNOSIS — R609 Edema, unspecified: Secondary | ICD-10-CM | POA: Insufficient documentation

## 2015-11-17 DIAGNOSIS — N183 Chronic kidney disease, stage 3 (moderate): Secondary | ICD-10-CM

## 2015-11-17 MED ORDER — INSULIN DETEMIR 100 UNIT/ML ~~LOC~~ SOLN: 12.0000 [IU] | Freq: Every day | SUBCUTANEOUS | Status: DC

## 2015-11-17 MED ORDER — FUROSEMIDE 40 MG PO TABS: 40.0000 mg | ORAL_TABLET | ORAL | Status: DC

## 2015-11-17 NOTE — Assessment & Plan Note (Signed)
-   Suspect multifactorial given improvement with elevation and lasix - Recommend compression stockings and elevation - Hold lasix per above. Consider discontinuing. - Recheck BMP

## 2015-11-17 NOTE — Assessment & Plan Note (Addendum)
-   A1C improved to 6.8 from 9.7. - Continue Metformin, Trulicity - Lower Levemir to 12 units daily - Follow up in 3 months

## 2015-11-17 NOTE — Assessment & Plan Note (Signed)
-   Will obtain urinalysis - Hold lasix for 4 days and then resume every other day dosing. - Consider referral to urology

## 2015-11-18 ENCOUNTER — Encounter: Payer: Self-pay | Admitting: Family Medicine

## 2015-12-06 ENCOUNTER — Telehealth: Payer: Self-pay | Admitting: *Deleted

## 2015-12-06 DIAGNOSIS — E119 Type 2 diabetes mellitus without complications: Secondary | ICD-10-CM

## 2015-12-06 MED ORDER — INSULIN DETEMIR 100 UNIT/ML ~~LOC~~ SOLN: 12.0000 [IU] | Freq: Every day | SUBCUTANEOUS | Status: DC

## 2015-12-06 NOTE — Telephone Encounter (Signed)
Pharmacy never received rx of levemir from 3/5 called in verbally. Tino Ronan, Maryjo RochesterJessica Dawn, CMA

## 2016-01-28 ENCOUNTER — Other Ambulatory Visit: Payer: Self-pay | Admitting: Family Medicine

## 2016-02-03 ENCOUNTER — Other Ambulatory Visit: Payer: Self-pay | Admitting: Family Medicine

## 2016-02-25 ENCOUNTER — Other Ambulatory Visit: Payer: Self-pay | Admitting: Family Medicine

## 2016-03-24 ENCOUNTER — Telehealth: Payer: Self-pay | Admitting: *Deleted

## 2016-03-24 DIAGNOSIS — I10 Essential (primary) hypertension: Secondary | ICD-10-CM

## 2016-03-24 MED ORDER — FUROSEMIDE 40 MG PO TABS: 40.0000 mg | ORAL_TABLET | ORAL | Status: DC

## 2016-03-25 ENCOUNTER — Other Ambulatory Visit: Payer: Self-pay | Admitting: Family Medicine

## 2016-03-25 MED ORDER — FUROSEMIDE 40 MG PO TABS: 40.0000 mg | ORAL_TABLET | ORAL | Status: DC

## 2016-03-25 NOTE — Addendum Note (Signed)
Addended by: Clovis PuMARTIN, Berkleigh Beckles L on: 03/25/2016 10:44 AM   Modules accepted: Orders

## 2016-03-25 NOTE — Telephone Encounter (Signed)
Received another refill request for Lasix 40 mg.  Refill was approved 03/24/16, but stated no print.  Refill resent electronically.  Clovis PuMartin, Latrease Kunde L, RN

## 2016-06-15 ENCOUNTER — Other Ambulatory Visit: Payer: Self-pay | Admitting: Family Medicine

## 2016-06-15 NOTE — Telephone Encounter (Signed)
Daughter is calling because her [place] . [facility] not [facility] her medication to them from here. They said that there system can not talk with [place] She would like all of her mothers medication called [facility] [place]. [facility] that [facility] is [phone]. jw

## 2016-06-16 NOTE — Telephone Encounter (Signed)
Please call back to see which medications she needs. Also enquire if this is a permanent move. Thanks!

## 2016-06-17 NOTE — Telephone Encounter (Signed)
Spoke to daughter. Pt is undecided if she is going to stay in [place] come back [place] At this time Metformin is needed. Daughter was hoping to get all meds refilled at the [facility] in case her mom stays for more than a month. Please advise. Sunday SpillersSharon T Saunders, CMA

## 2016-06-24 NOTE — Telephone Encounter (Signed)
Daughter is calling because we have not sent in her mother's Metformin. [place] is [place] temporarily  [place] [facility] phone number [phone]. jw

## 2016-06-26 ENCOUNTER — Other Ambulatory Visit: Payer: Self-pay | Admitting: Family Medicine

## 2016-06-26 MED ORDER — METFORMIN HCL 500 MG PO TABS: 1000.0000 mg | ORAL_TABLET | Freq: Two times a day (BID) | ORAL | 1 refills | Status: DC

## 2016-06-26 NOTE — Telephone Encounter (Signed)
Metformin called into pharmacy. 

## 2016-06-29 ENCOUNTER — Telehealth: Payer: Self-pay | Admitting: Family Medicine

## 2016-06-29 DIAGNOSIS — I10 Essential (primary) hypertension: Secondary | ICD-10-CM

## 2016-06-29 DIAGNOSIS — E119 Type 2 diabetes mellitus without complications: Secondary | ICD-10-CM

## 2016-06-29 NOTE — Telephone Encounter (Signed)
Daughter is calling because there have been a lot of problems getting her medications since she is sometimes home bound. She would like to transfer all her medications to [facility]. Please do this as soon as possible so that there are no lapse in getting her medications. jw

## 2016-06-30 ENCOUNTER — Telehealth: Payer: Self-pay | Admitting: *Deleted

## 2016-06-30 MED ORDER — INSULIN DETEMIR 100 UNIT/ML ~~LOC~~ SOLN: 12.0000 [IU] | Freq: Every day | SUBCUTANEOUS | 2 refills | Status: DC

## 2016-06-30 MED ORDER — DULAGLUTIDE 1.5 MG/0.5ML ~~LOC~~ SOAJ: 1.5000 mg | SUBCUTANEOUS | 11 refills | Status: DC

## 2016-06-30 MED ORDER — ATENOLOL 50 MG PO TABS: 50.0000 mg | ORAL_TABLET | Freq: Every day | ORAL | 3 refills | Status: AC

## 2016-06-30 MED ORDER — METFORMIN HCL 500 MG PO TABS: 1000.0000 mg | ORAL_TABLET | Freq: Two times a day (BID) | ORAL | 1 refills | Status: DC

## 2016-06-30 MED ORDER — ALBUTEROL SULFATE HFA 108 (90 BASE) MCG/ACT IN AERS: 2.0000 | INHALATION_SPRAY | Freq: Four times a day (QID) | RESPIRATORY_TRACT | 12 refills | Status: AC | PRN

## 2016-06-30 MED ORDER — FUROSEMIDE 40 MG PO TABS: 40.0000 mg | ORAL_TABLET | ORAL | 3 refills | Status: DC

## 2016-06-30 MED ORDER — ALBUTEROL SULFATE (2.5 MG/3ML) 0.083% IN NEBU: INHALATION_SOLUTION | RESPIRATORY_TRACT | 3 refills | Status: AC

## 2016-06-30 MED ORDER — LISINOPRIL 5 MG PO TABS: 5.0000 mg | ORAL_TABLET | Freq: Every day | ORAL | 3 refills | Status: AC

## 2016-06-30 MED ORDER — FLUTICASONE-SALMETEROL 250-50 MCG/DOSE IN AEPB: INHALATION_SPRAY | RESPIRATORY_TRACT | 3 refills | Status: AC

## 2016-06-30 NOTE — Telephone Encounter (Signed)
Medications sent to [facility] 

## 2016-06-30 NOTE — Telephone Encounter (Signed)
[facility] pharmacy called stating the metformin quantity was sent for #90 which is a 23 day supply.  Verbal order given to change to #120 tablets by Dr. Caroleen Hammanumley.  Clovis PuMartin, Meriah Shands L, RN

## 2016-08-10 ENCOUNTER — Other Ambulatory Visit: Payer: Self-pay | Admitting: Family Medicine

## 2016-08-10 DIAGNOSIS — E119 Type 2 diabetes mellitus without complications: Secondary | ICD-10-CM

## 2016-08-10 NOTE — Telephone Encounter (Signed)
Pt needs a refill on Levemir, pt uses [facility]. [phone]. Please advise. Thanks! ep

## 2016-08-13 MED ORDER — INSULIN DETEMIR 100 UNIT/ML ~~LOC~~ SOLN: 12.0000 [IU] | Freq: Every day | SUBCUTANEOUS | 0 refills | Status: DC

## 2016-08-13 MED ORDER — INSULIN DETEMIR 100 UNIT/ML ~~LOC~~ SOLN: 12.0000 [IU] | Freq: Every day | SUBCUTANEOUS | 2 refills | Status: DC

## 2016-08-13 NOTE — Telephone Encounter (Signed)
Prescriptions sent. Please note that further refills will not be given unless seen in office. Recommend establishing with physician in [place].

## 2016-08-13 NOTE — Telephone Encounter (Signed)
Daughter is calling because she needs the Levemir sent to [facility], but she also needs a 30 day supply sent to the [facility] [place] on [address] so that she doesn't run out since [facility] takes a [facility]. [name]

## 2016-08-14 NOTE — Telephone Encounter (Signed)
Spoke to pt and informed her of the message below. Pt seemed confused. I called daughter and gave her the information. Seems that pt stays up all night and sleeps during the day. Daughter said her mom does get confused in the mornings. Daughter understand that pt needs to [place] and that no more refills will be given unless pt comes in for office visit. Sunday SpillersSharon T Saunders, CMA

## 2016-08-15 MED ORDER — METFORMIN HCL 500 MG PO TABS: 1000.0000 mg | ORAL_TABLET | Freq: Two times a day (BID) | ORAL | 0 refills | Status: AC

## 2016-08-15 MED ORDER — INSULIN DETEMIR 100 UNIT/ML ~~LOC~~ SOLN: 12.0000 [IU] | Freq: Every day | SUBCUTANEOUS | 0 refills | Status: AC

## 2016-08-15 MED ORDER — DULAGLUTIDE 1.5 MG/0.5ML ~~LOC~~ SOAJ: 1.5000 mg | SUBCUTANEOUS | 0 refills | Status: AC

## 2016-08-15 MED ORDER — INSULIN DETEMIR 100 UNIT/ML ~~LOC~~ SOLN: 12.0000 [IU] | Freq: Every day | SUBCUTANEOUS | 0 refills | Status: DC

## 2016-09-18 ENCOUNTER — Other Ambulatory Visit: Payer: Self-pay | Admitting: Family Medicine

## 2016-09-18 NOTE — Telephone Encounter (Signed)
Pt called because she is out of her sugar pills. She really needs these today ASAP. She is at her daughter's home in [place]. Please send these to the [facility] [phone], [place] [id number] [phone]. Diana Jacobsonjw

## 2016-09-21 NOTE — Telephone Encounter (Signed)
Discussed previously that no further refills would be given from our office unless patient is seen. Recommend she find a PCP in [place] to evaluate Diana Clayton for further refills.

## 2016-09-21 NOTE — Telephone Encounter (Signed)
Informed pt of below and she informed me that she [place] an appointment [place] on Wednesday and I told her to be sure to inform the doctor there that she would need her medication refilled for the requested medication mentioned below. Pt understood. Lamonte SakaiZimmerman Rumple, Zora Glendenning D, [place]

## 2016-09-30 ENCOUNTER — Other Ambulatory Visit: Payer: Self-pay | Admitting: Family Medicine

## 2016-09-30 DIAGNOSIS — I10 Essential (primary) hypertension: Secondary | ICD-10-CM

## 2016-12-28 ENCOUNTER — Other Ambulatory Visit: Payer: Self-pay | Admitting: Family Medicine

## 2016-12-28 DIAGNOSIS — I10 Essential (primary) hypertension: Secondary | ICD-10-CM
